# Patient Record
Sex: Male | Born: 1960 | Race: White | Hispanic: No | Marital: Single | Smoking: Current every day smoker
Health system: Southern US, Community
[De-identification: ages and names within clinical notes are randomized; demographics above are authoritative.]

## PROBLEM LIST (undated history)

## (undated) DIAGNOSIS — M1990 Unspecified osteoarthritis, unspecified site: Secondary | ICD-10-CM

## (undated) DIAGNOSIS — E7800 Pure hypercholesterolemia, unspecified: Secondary | ICD-10-CM

## (undated) DIAGNOSIS — M5430 Sciatica, unspecified side: Secondary | ICD-10-CM

## (undated) DIAGNOSIS — I10 Essential (primary) hypertension: Secondary | ICD-10-CM

## (undated) DIAGNOSIS — E119 Type 2 diabetes mellitus without complications: Secondary | ICD-10-CM

## (undated) DIAGNOSIS — J449 Chronic obstructive pulmonary disease, unspecified: Secondary | ICD-10-CM

## (undated) HISTORY — PX: OTHER SURGICAL HISTORY: SHX169

---

## 2007-09-30 ENCOUNTER — Emergency Department: Payer: Self-pay | Admitting: Emergency Medicine

## 2007-12-24 ENCOUNTER — Emergency Department: Payer: Self-pay | Admitting: Emergency Medicine

## 2007-12-24 IMAGING — US US PELVIS LIMITED
1 series · 17 of 25 positions shown · non-contrast
Comparison: none

REASON FOR EXAM: EVALUATE FOR ABSCESS
COMMENTS:

[Series 1: us pelvis limited · 17 of 41 slices shown]
[im 1/41]
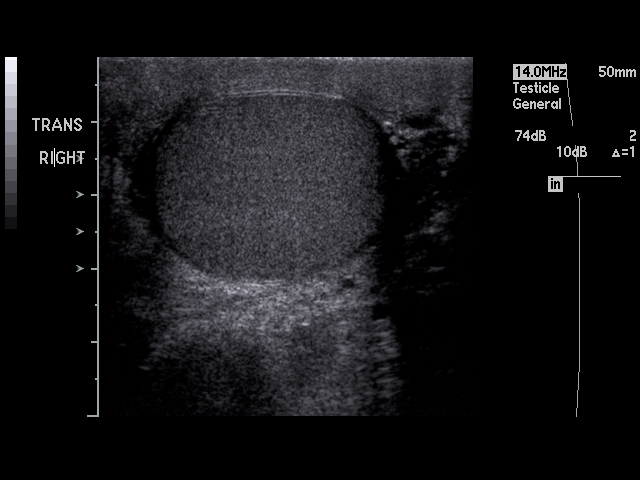
[im 4/41]
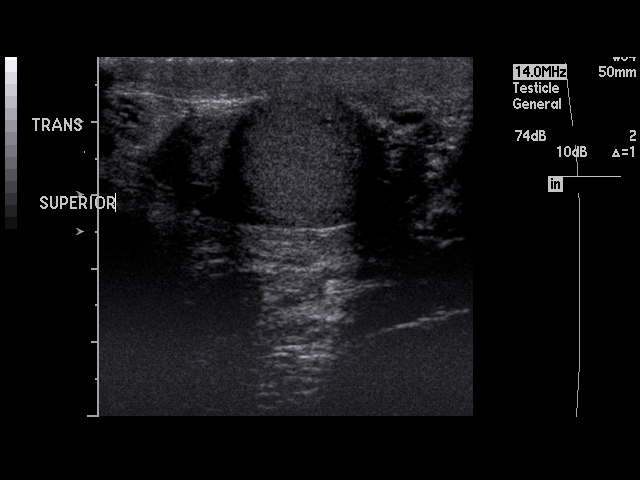
[im 6/41]
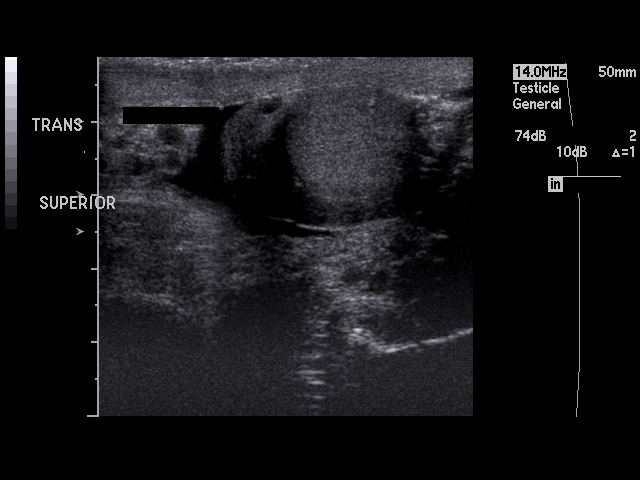
[im 9/41]
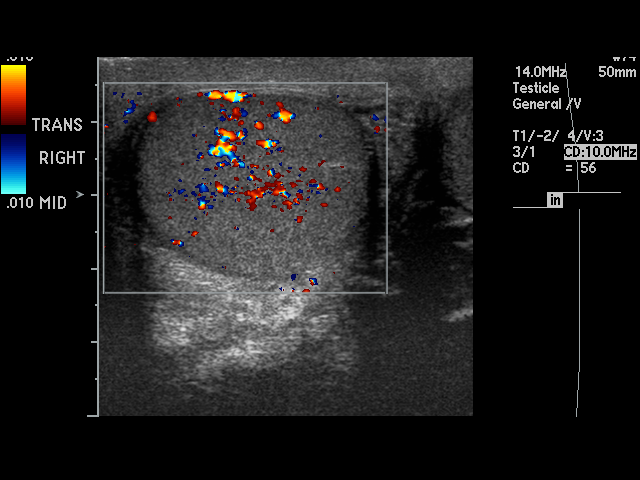
[im 11/41]
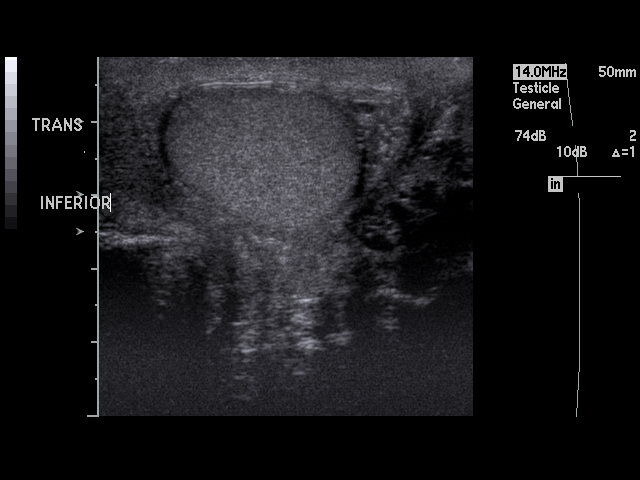
[im 14/41]
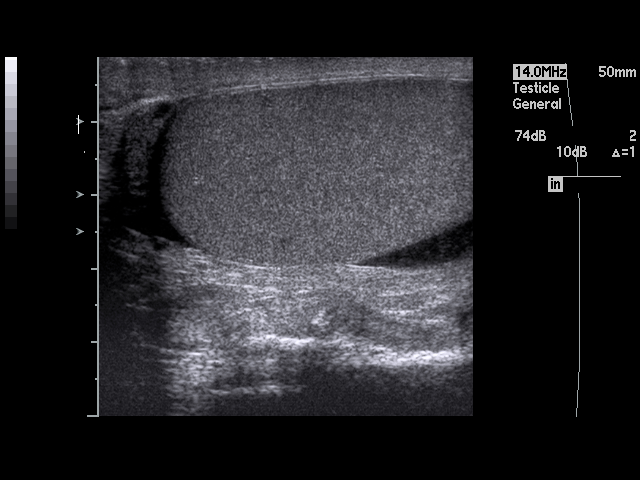
[im 16/41]
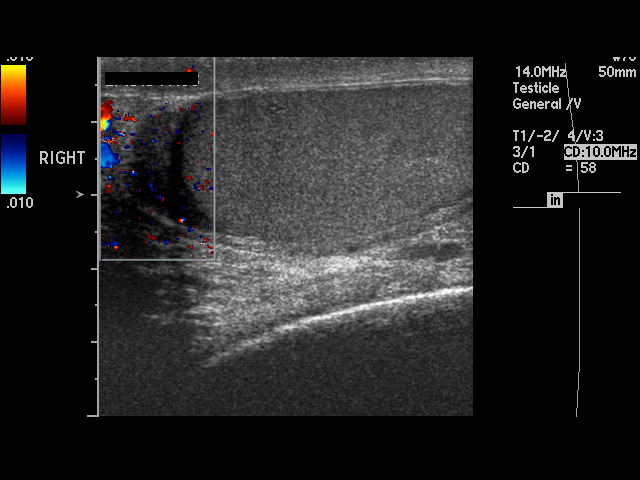
[im 19/41]
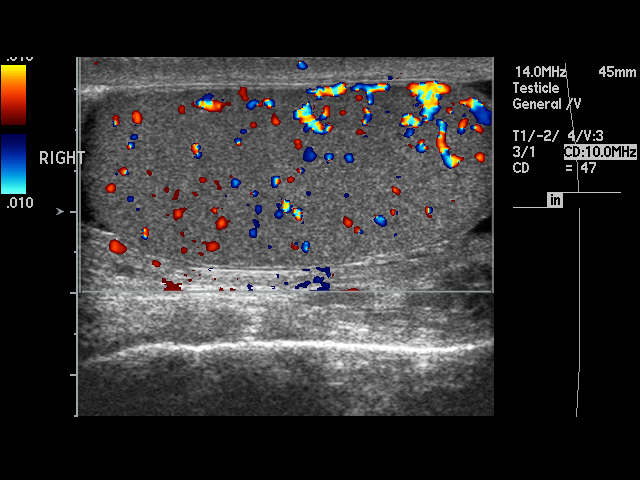
[im 21/41]
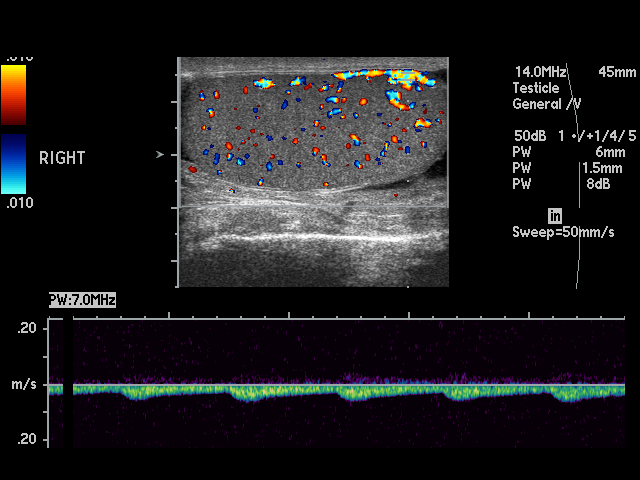
[im 22/41]
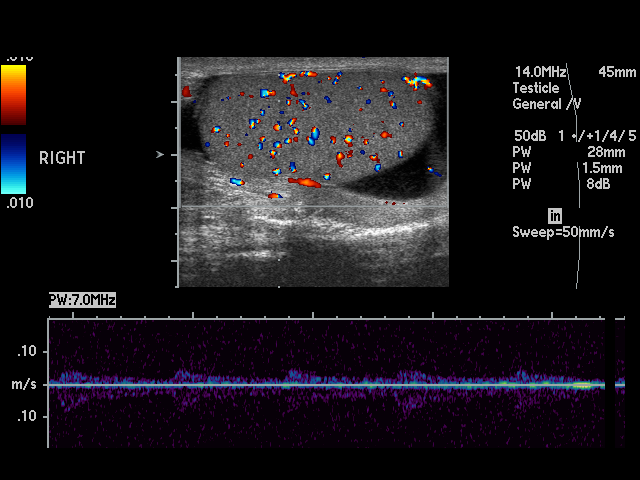
[im 26/41]
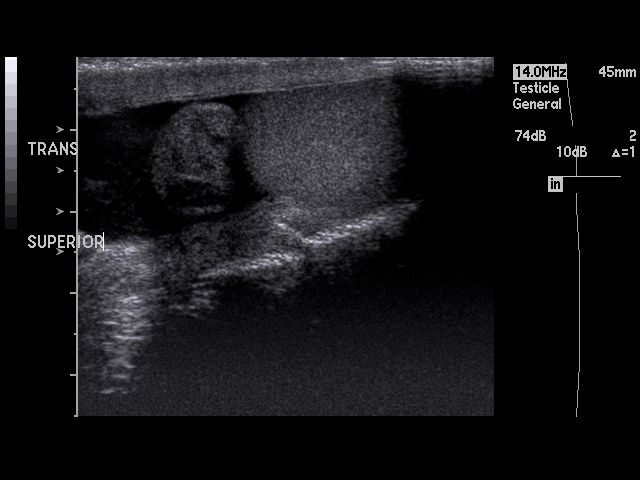
[im 27/41]
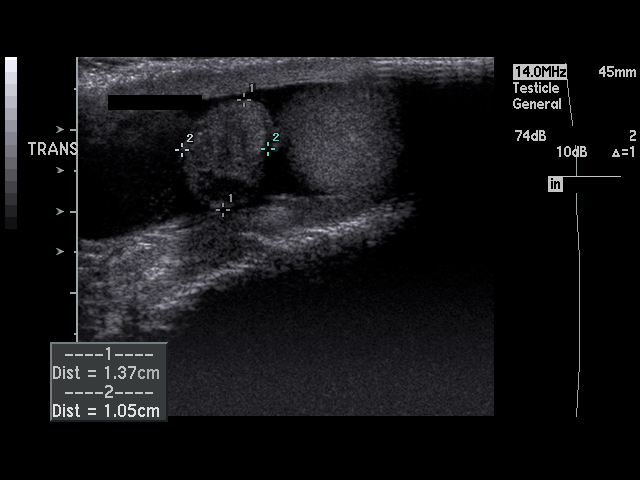
[im 31/41]
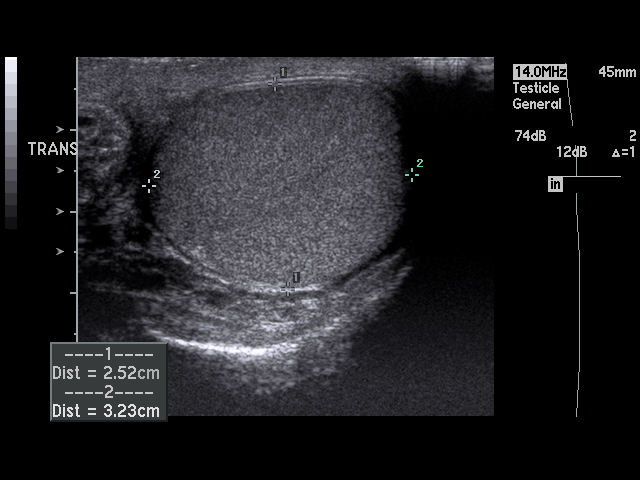
[im 32/41]
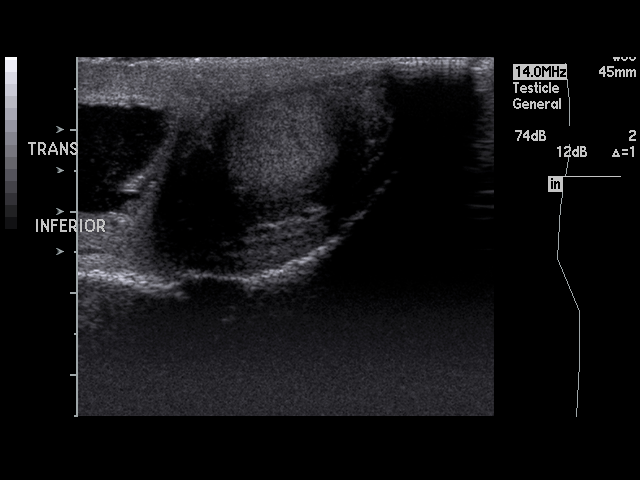
[im 36/41]
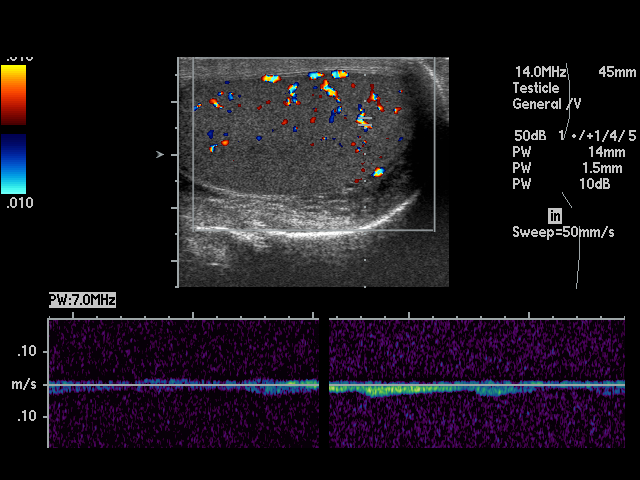
[im 37/41]
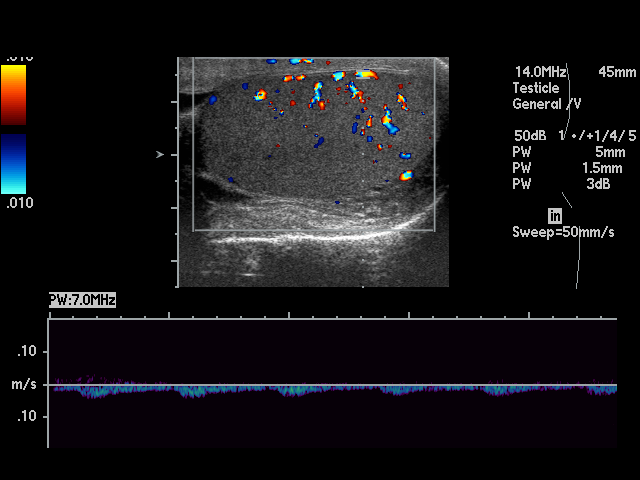
[im 41/41]
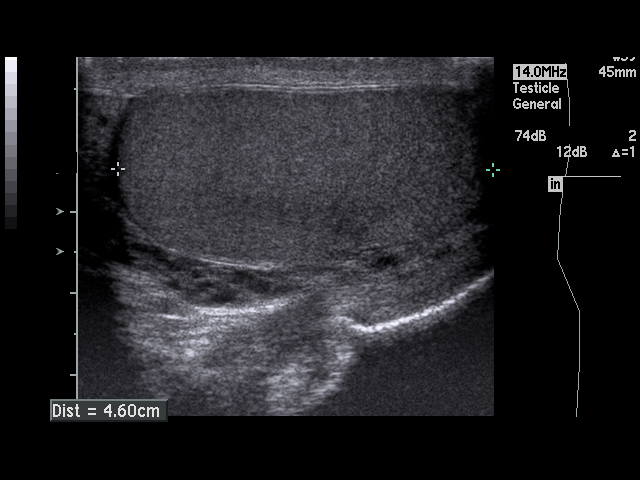

[17 of 25 positions shown; findings below may reference images not displayed]

PROCEDURE:     US  - US TESTICULAR  - December 24, 2007  [DATE]

RESULT:     The RIGHT testicle measures 5.0 x 2.63 x 3.26 cm. The LEFT
testicle  measures 4.6 x 2.59 x 3.2 cm. The testicles demonstrate a
homogeneous echotexture and grayscale flow is demonstrated within the RIGHT
and LEFT testicle.

The RIGHT epididymis measures 1.39 x 1.09 x 1.6 cm. The LEFT epididymis
measures 1.39 x 1.37 x 1.05 cm. Flow is identified within the RIGHT and LEFT
epididymides. A moderate sized, RIGHT hydrocele with internal echoes is
identified. A smaller, LEFT hydrocele is identified. There is thickening of
the tunica on the RIGHT.
IMPRESSION: 1.     No ultrasound evidence reflecting the sequelae of decreased flow to
the RIGHT or LEFT testicle to suggest torsion.
2.     Findings possibly representing cellulitis involving the tunica on the
RIGHT. There is a moderate size, complex hydrocele on the RIGHT and a small
hydrocele on the LEFT.
3.     Dr. Meet Gill of the Emergency Department was informed of these findings
via preliminary faxed report on 12/24/2007 at [DATE], Central Standard Time.

## 2009-12-19 ENCOUNTER — Emergency Department: Payer: Self-pay | Admitting: Emergency Medicine

## 2009-12-19 IMAGING — US ABDOMEN ULTRASOUND
1 series · 17 of 25 positions shown · non-contrast
Comparison: none

REASON FOR EXAM: pain, upper right Quad
COMMENTS:

PROCEDURE:     US  - US ABDOMEN GENERAL SURVEY  - December 19, 2009  [DATE]
RESULT:     Comparison: None
TECHNIQUE: Multiple gray-scale and color-flow Doppler images of the abdomen
are presented for review.

[Series 1: abdomen ultrasound · 17 of 61 slices shown]
[im 1/61]
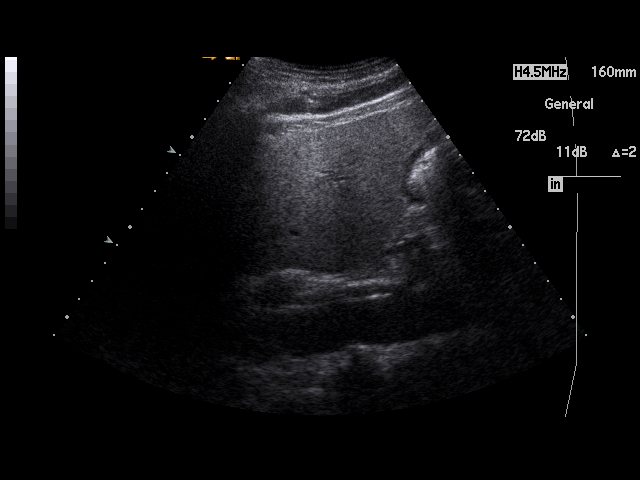
[im 6/61]
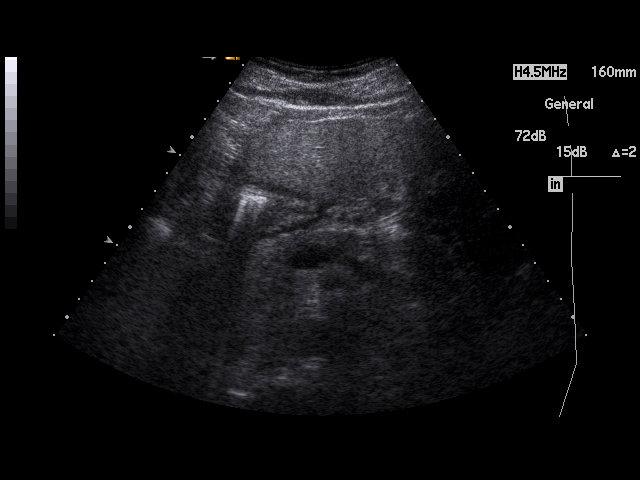
[im 8/61]
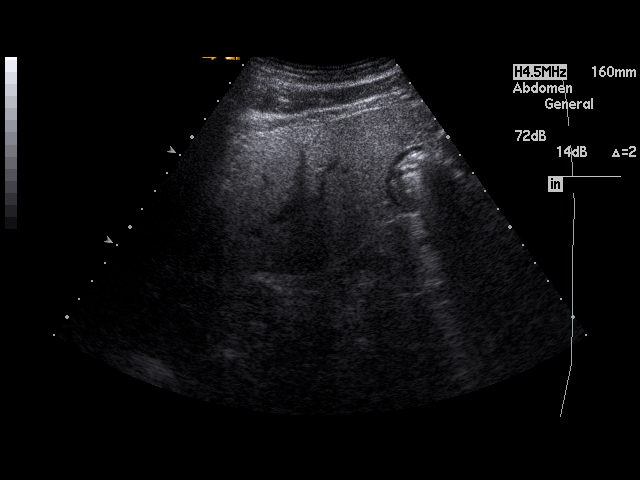
[im 13/61]
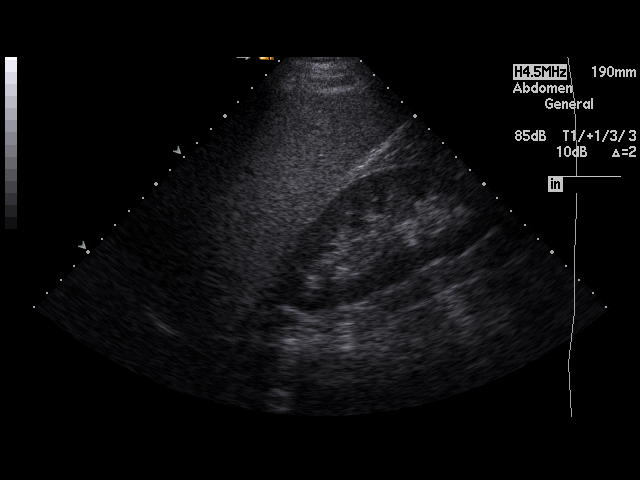
[im 16/61]
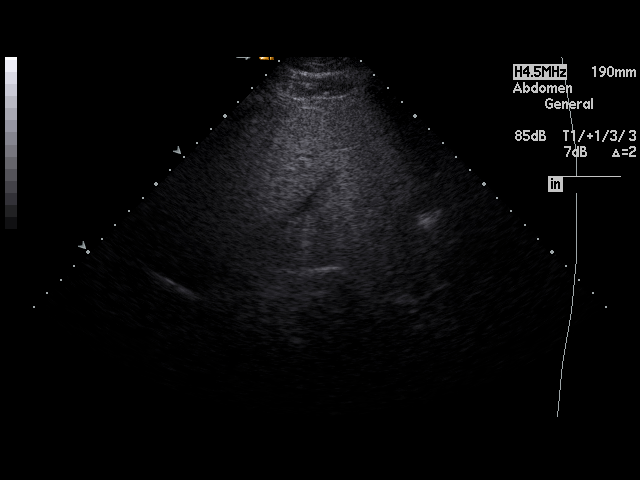
[im 21/61]
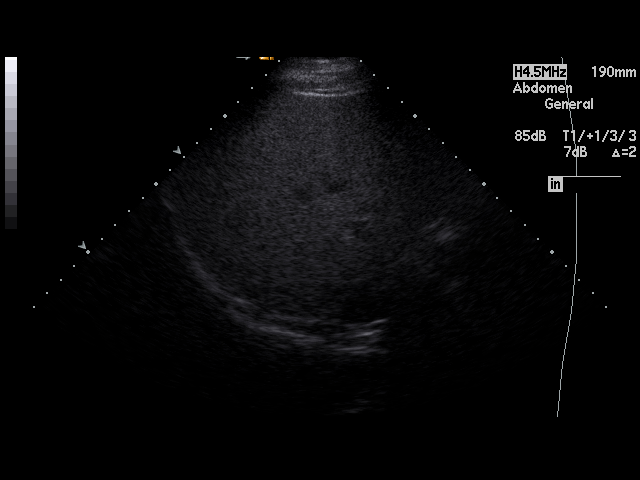
[im 23/61]
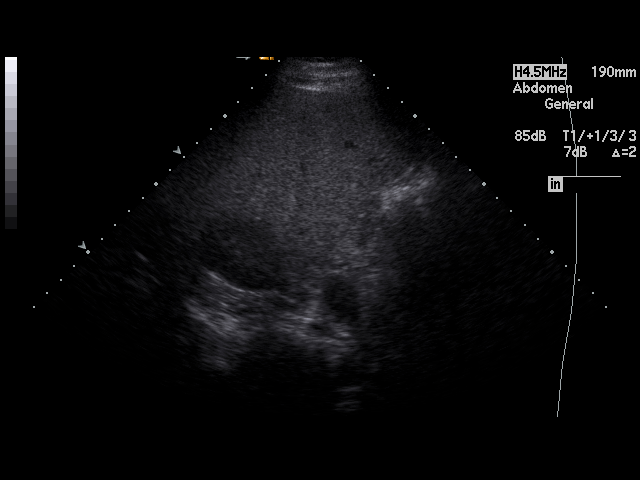
[im 28/61]
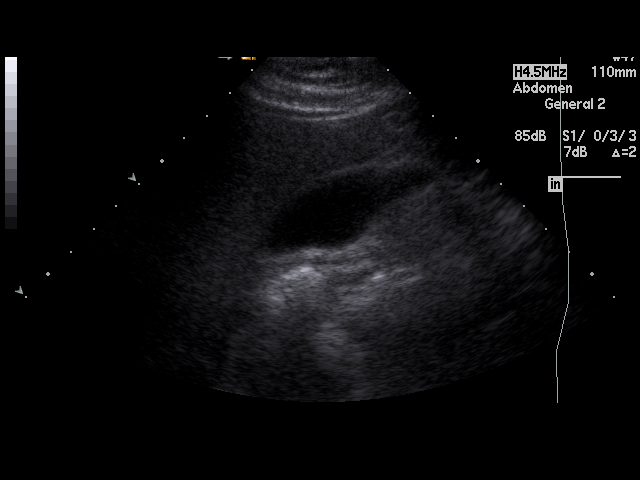
[im 31/61]
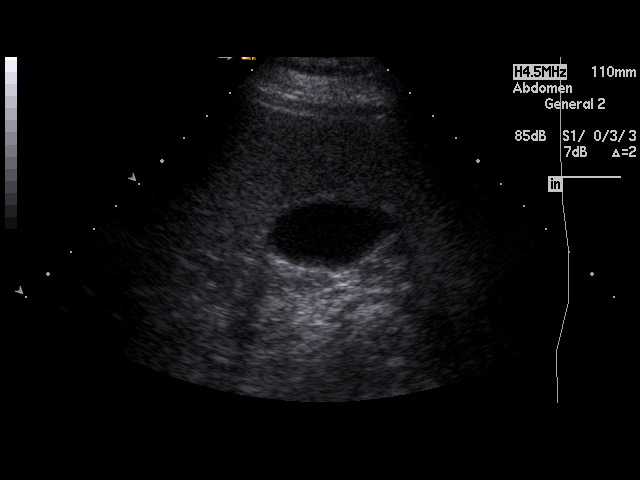
[im 33/61]
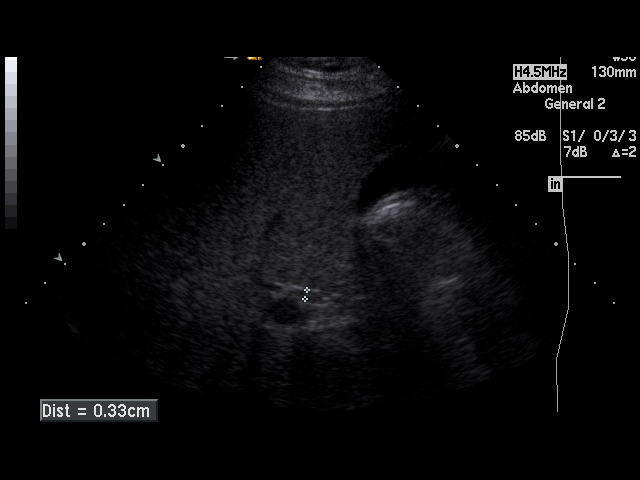
[im 38/61]
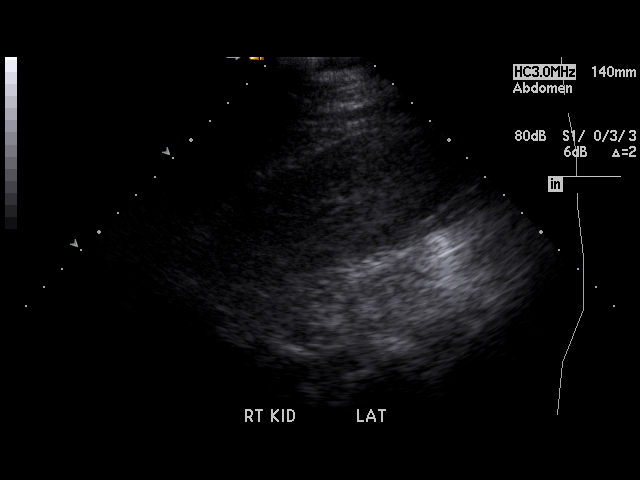
[im 41/61]
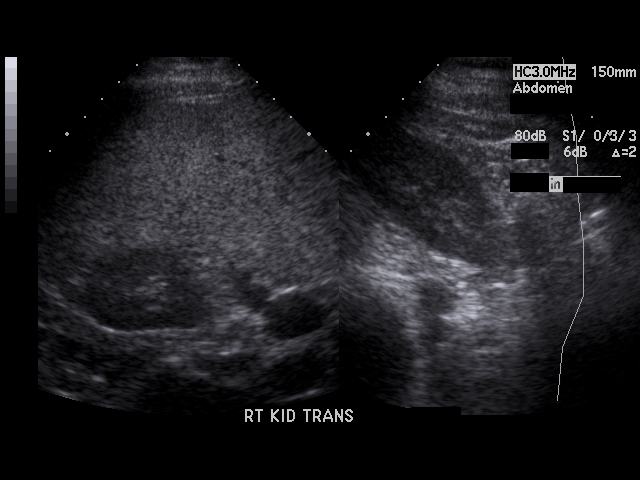
[im 46/61]
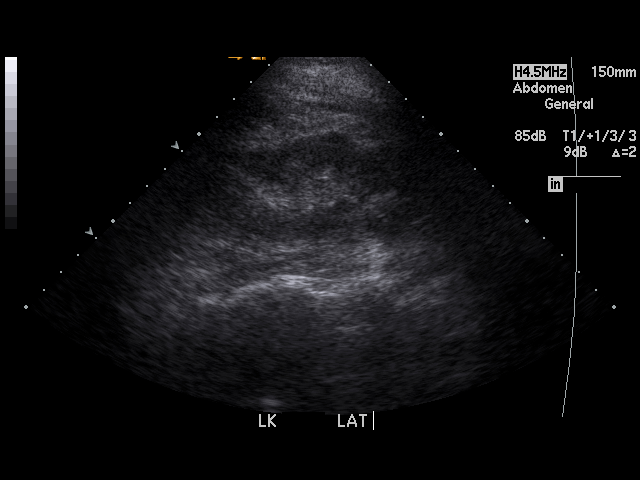
[im 48/61]
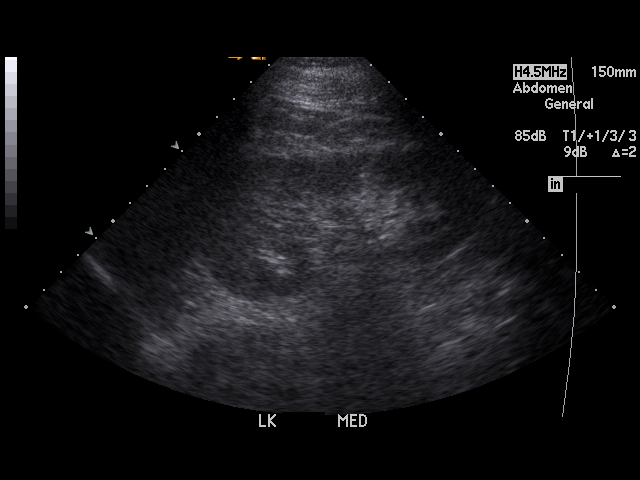
[im 53/61]
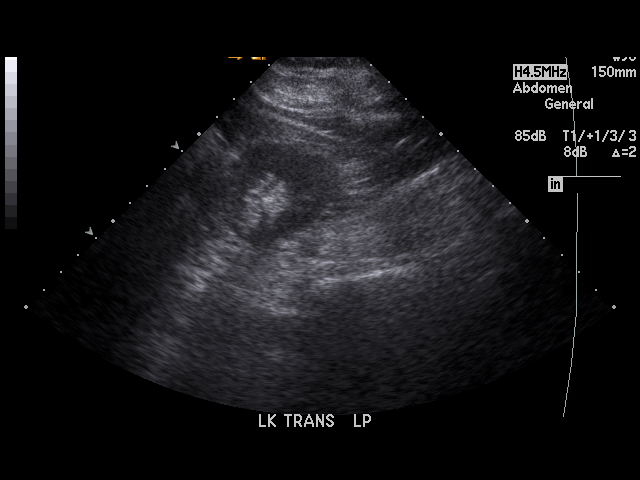
[im 56/61]
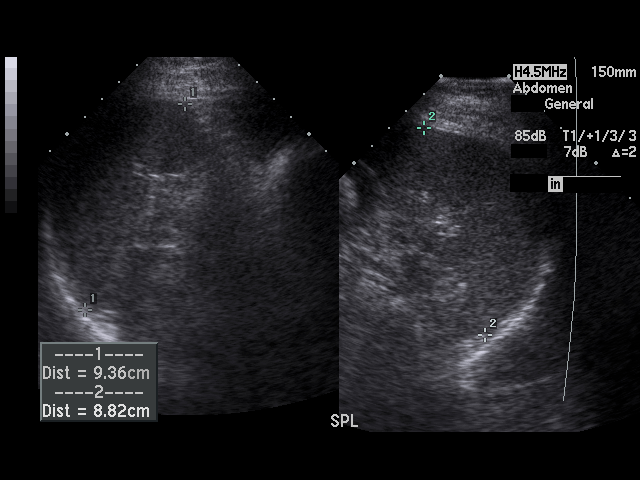
[im 61/61]
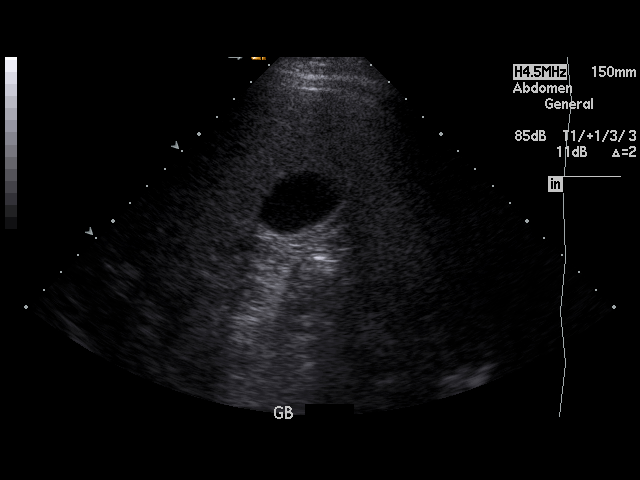

[17 of 25 positions shown; findings below may reference images not displayed]

FINDINGS: The liver is diffusely increased in echogenicity likely secondary to hepatic
steatosis. The liver is without evidence of a focal hepatic lesion.

There is no cholelithiasis or biliary sludge. There is no intra- or
extrahepatic biliary ductal dilatation. The common duct measures 3.3 mm in
maximal diameter. There is no gallbladder wall thickening, pericholecystic
fluid, or sonographic Murphy's sign.

The visualized portion of the pancreas is normal in echogenicity. The spleen
is unremarkable. Bilateral kidneys are normal in echogenicity and size. The
right kidney measures 13.9 x 5.9 x 5 cm. The left kidney measures 12.2 x
x 4.3 cm. There are 2 punctate nonobstructing left renal calculi. There is
no hydronephrosis. The abdominal aorta and IVC are unremarkable.
IMPRESSION: 1. Hepatic steatosis.

2. Nonobstructing left renal calculi.

3. No cholelithiasis or sonographic evidence of acute cholecystitis.

## 2009-12-19 IMAGING — CR DG CHEST 2V
1 series · 3 of 3 positions shown · non-contrast
Comparison: none

REASON FOR EXAM: epigastric pain
COMMENTS:

PROCEDURE:     DXR - DXR CHEST PA (OR AP) AND LATERAL  - December 19, 2009  [DATE]
RESULT:     Comparison: None

[Series 1: view not recorded · 0.17mm/px · 3 of 3 slices shown]
[im 1/3]
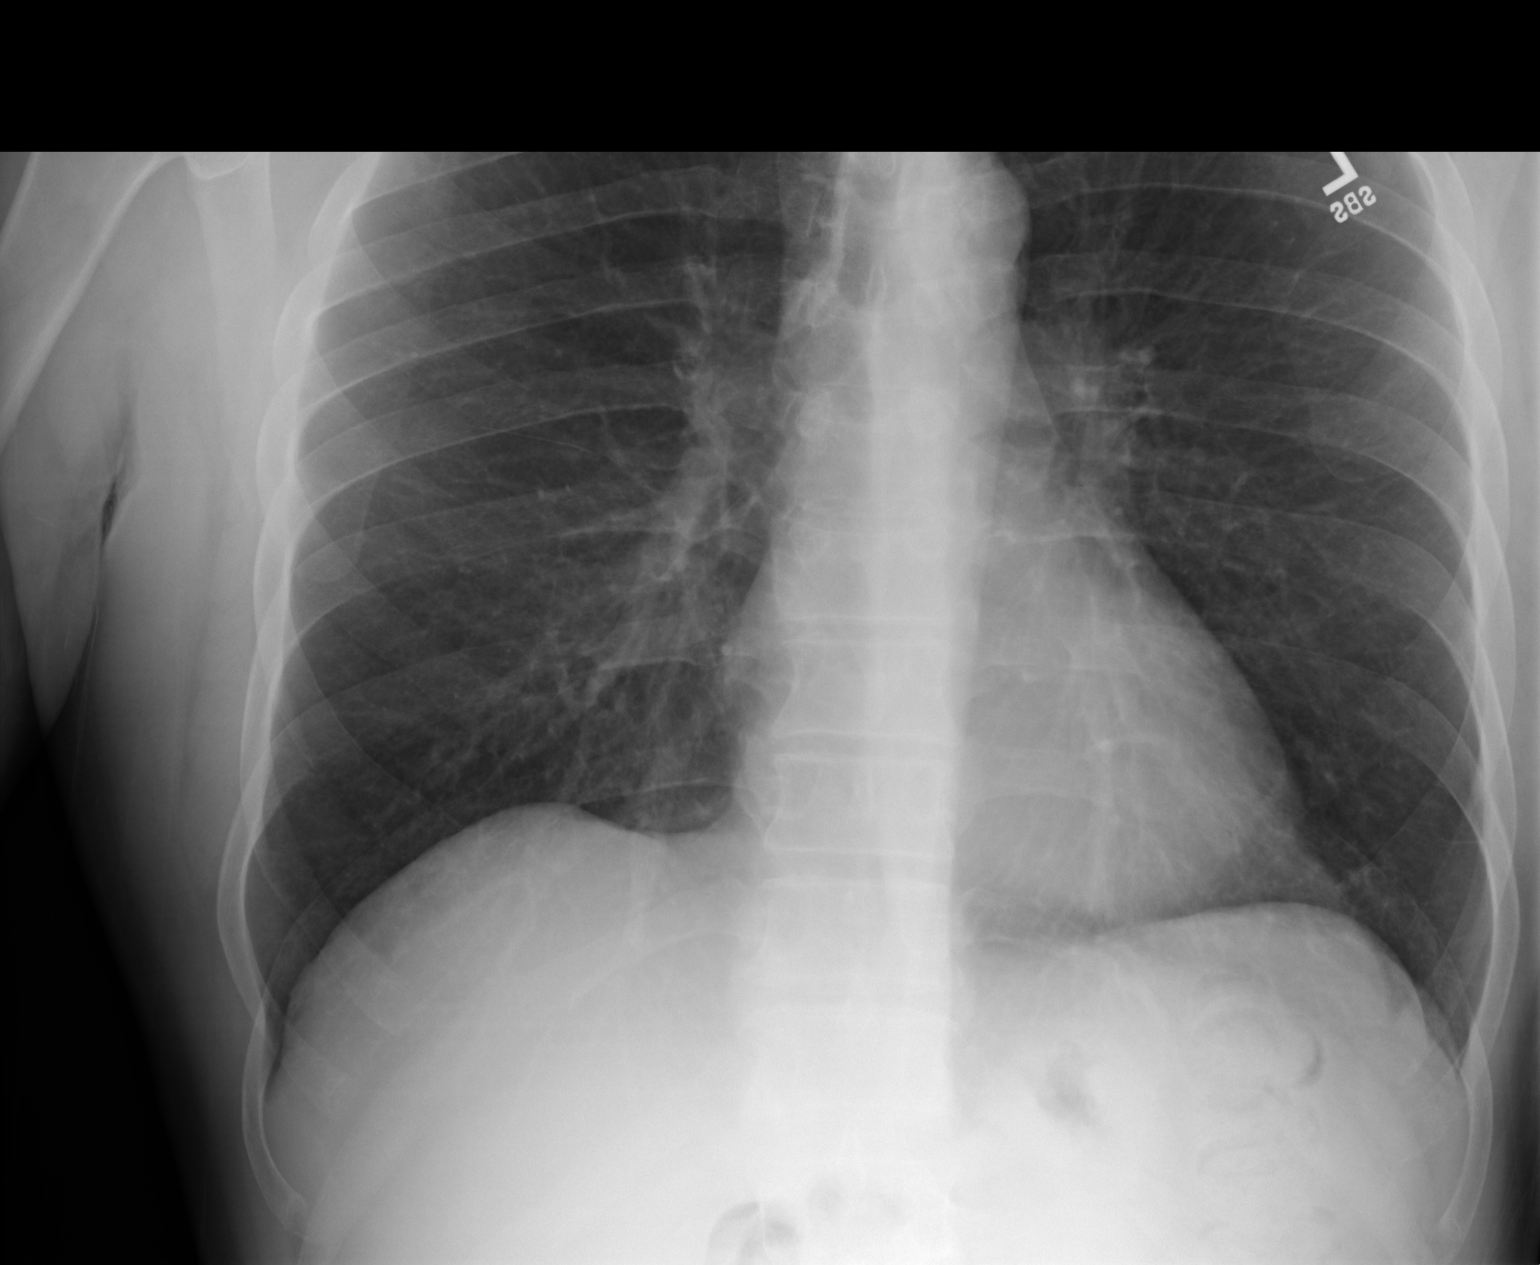
[im 2/3]
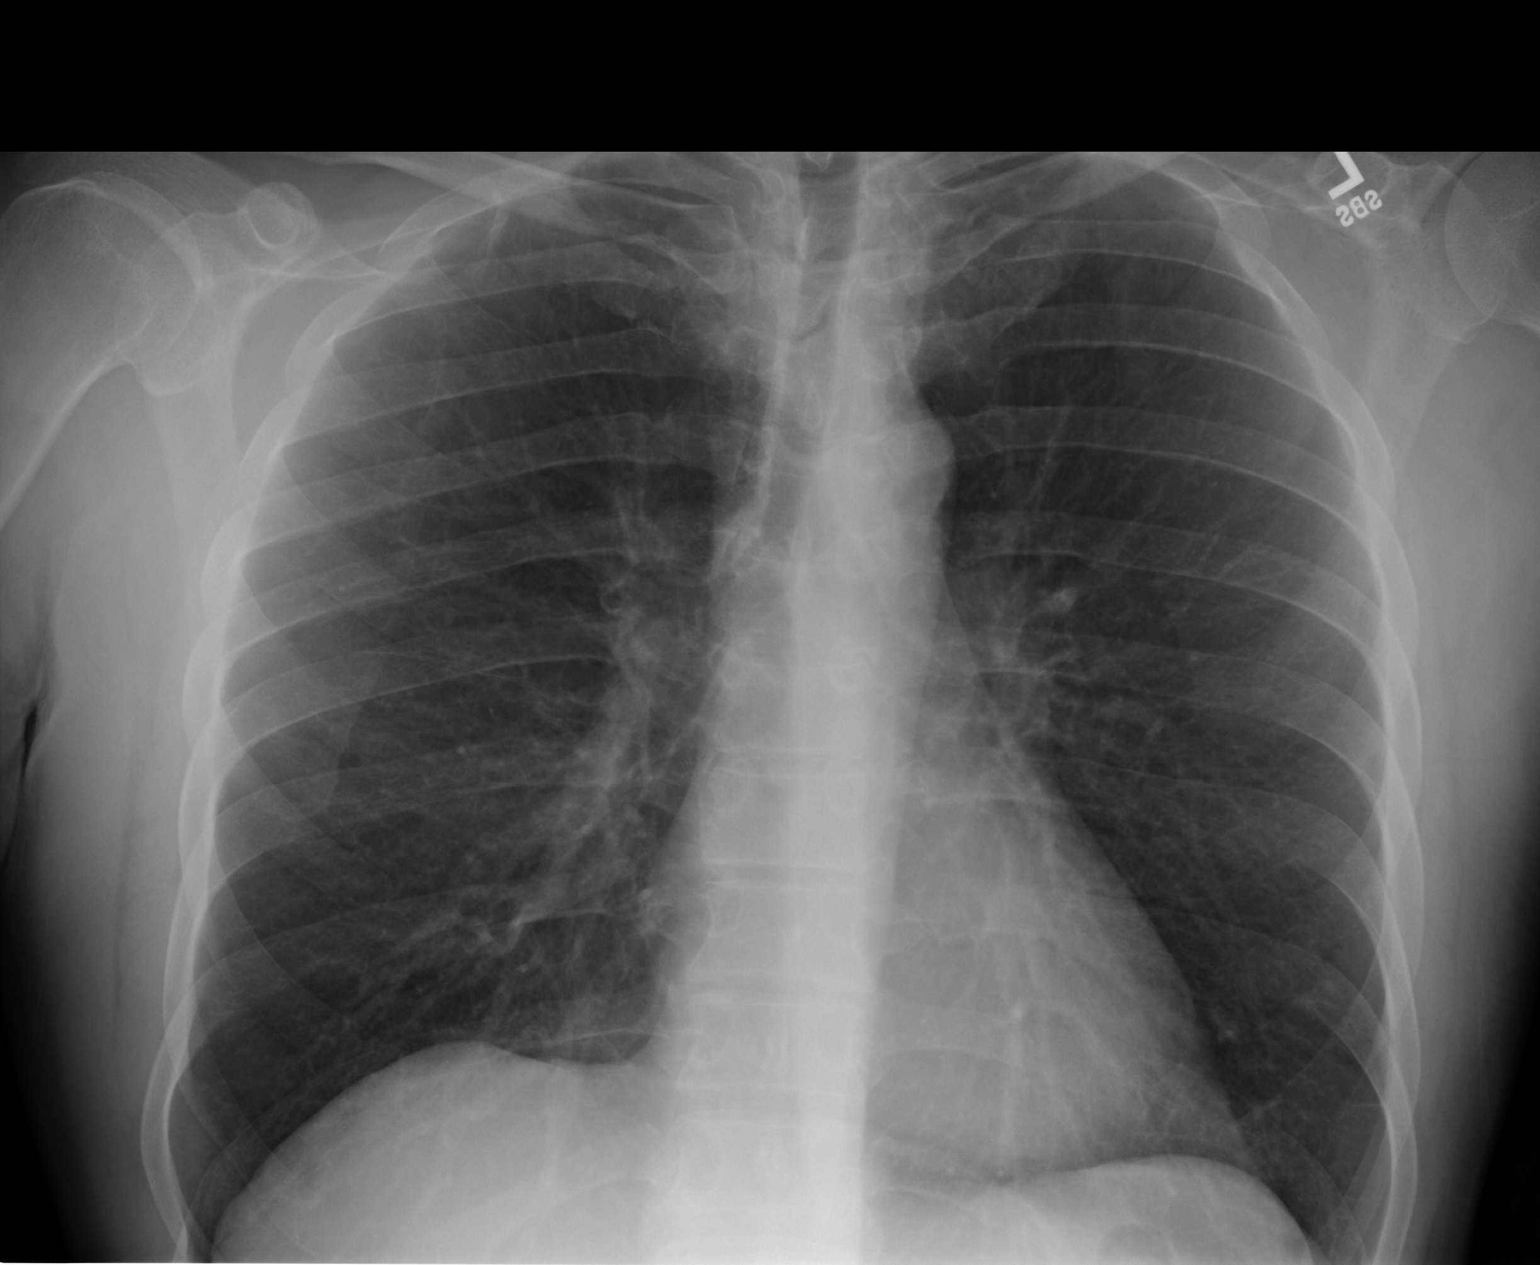
[im 3/3]
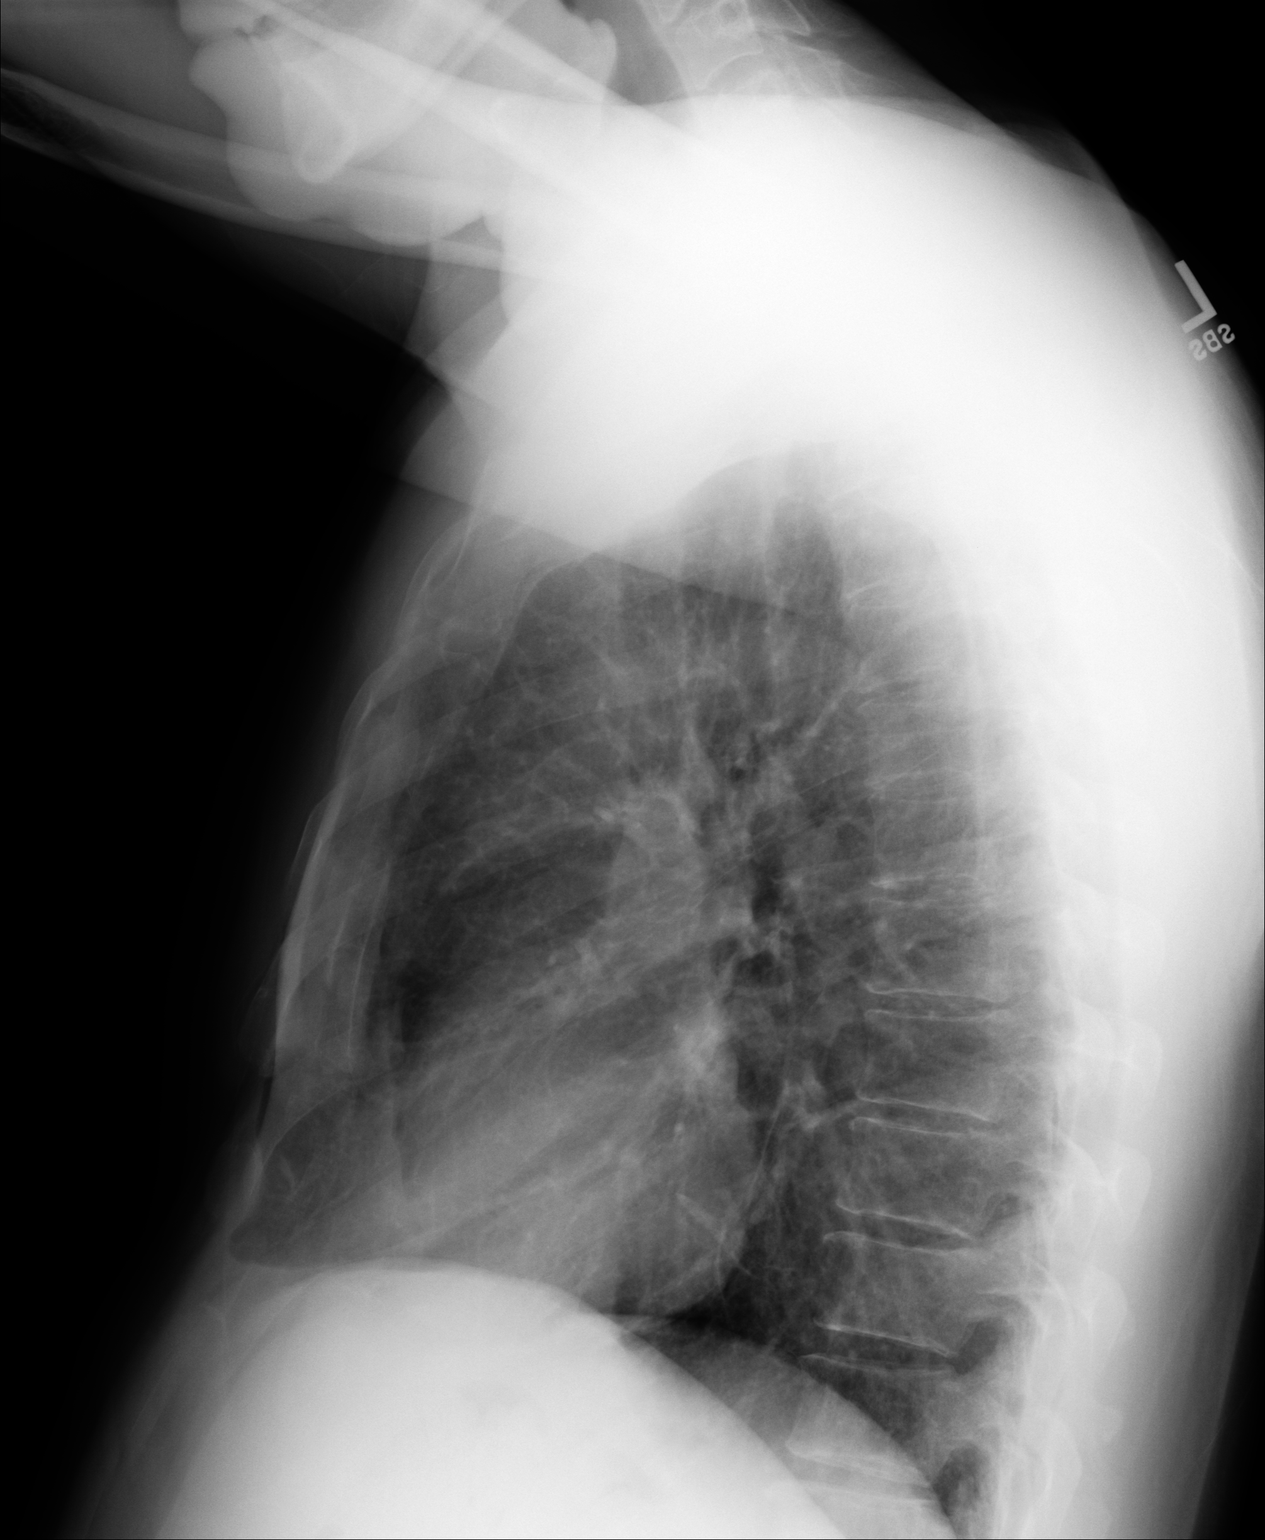

[3 of 3 positions shown; findings below may reference images not displayed]

FINDINGS: PA and lateral chest radiographs are provided. There is no focal parenchymal
opacity, pleural effusion, or pneumothorax. The heart and mediastinum are
unremarkable. The osseous structures are unremarkable.
IMPRESSION: No acute disease of the chest.

## 2010-02-20 ENCOUNTER — Emergency Department: Payer: Self-pay | Admitting: Unknown Physician Specialty

## 2010-02-20 IMAGING — CR DG HAND 2V*L*
1 series · 2 of 2 positions shown · non-contrast
Comparison: none

REASON FOR EXAM: pain swelling ?fb
COMMENTS:

PROCEDURE:     DXR - DXR HAND LT TWO VIEWS  - February 20, 2010 [DATE]
RESULT:     Comparison:  None

[Series 1: view not recorded · 0.17mm/px · 2 of 2 slices shown]
[im 1/2]
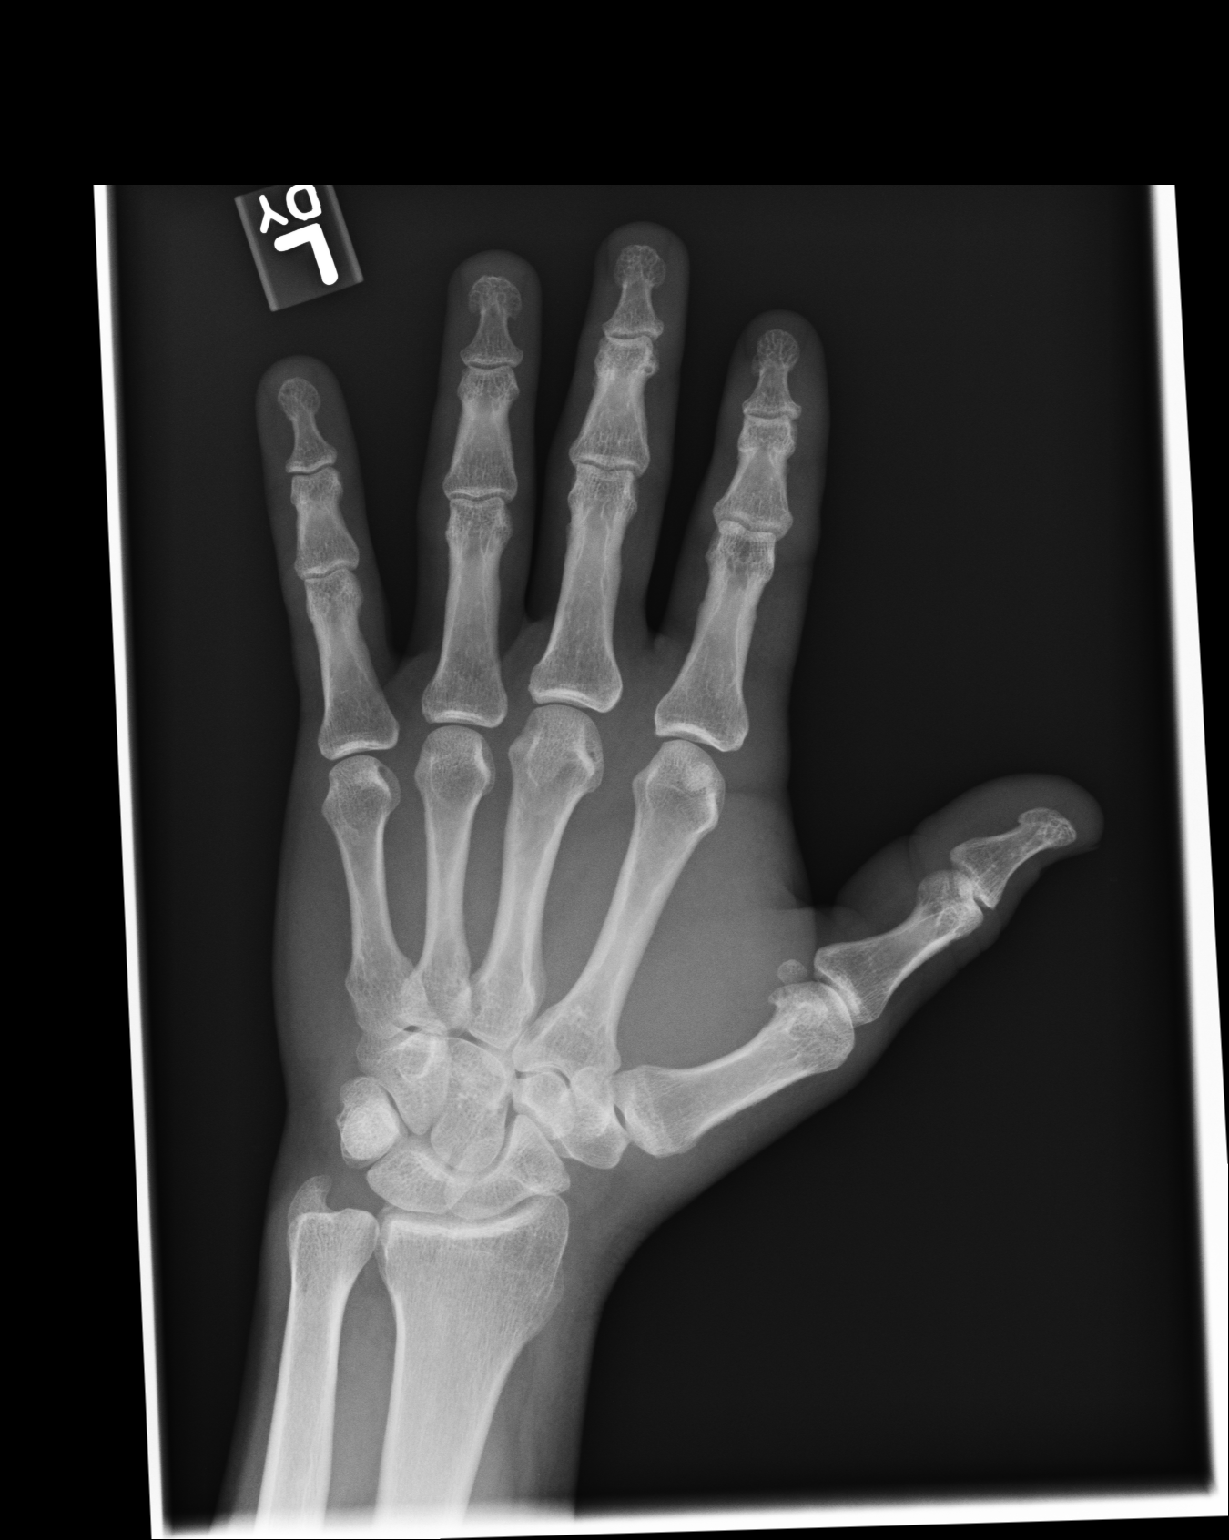
[im 2/2]
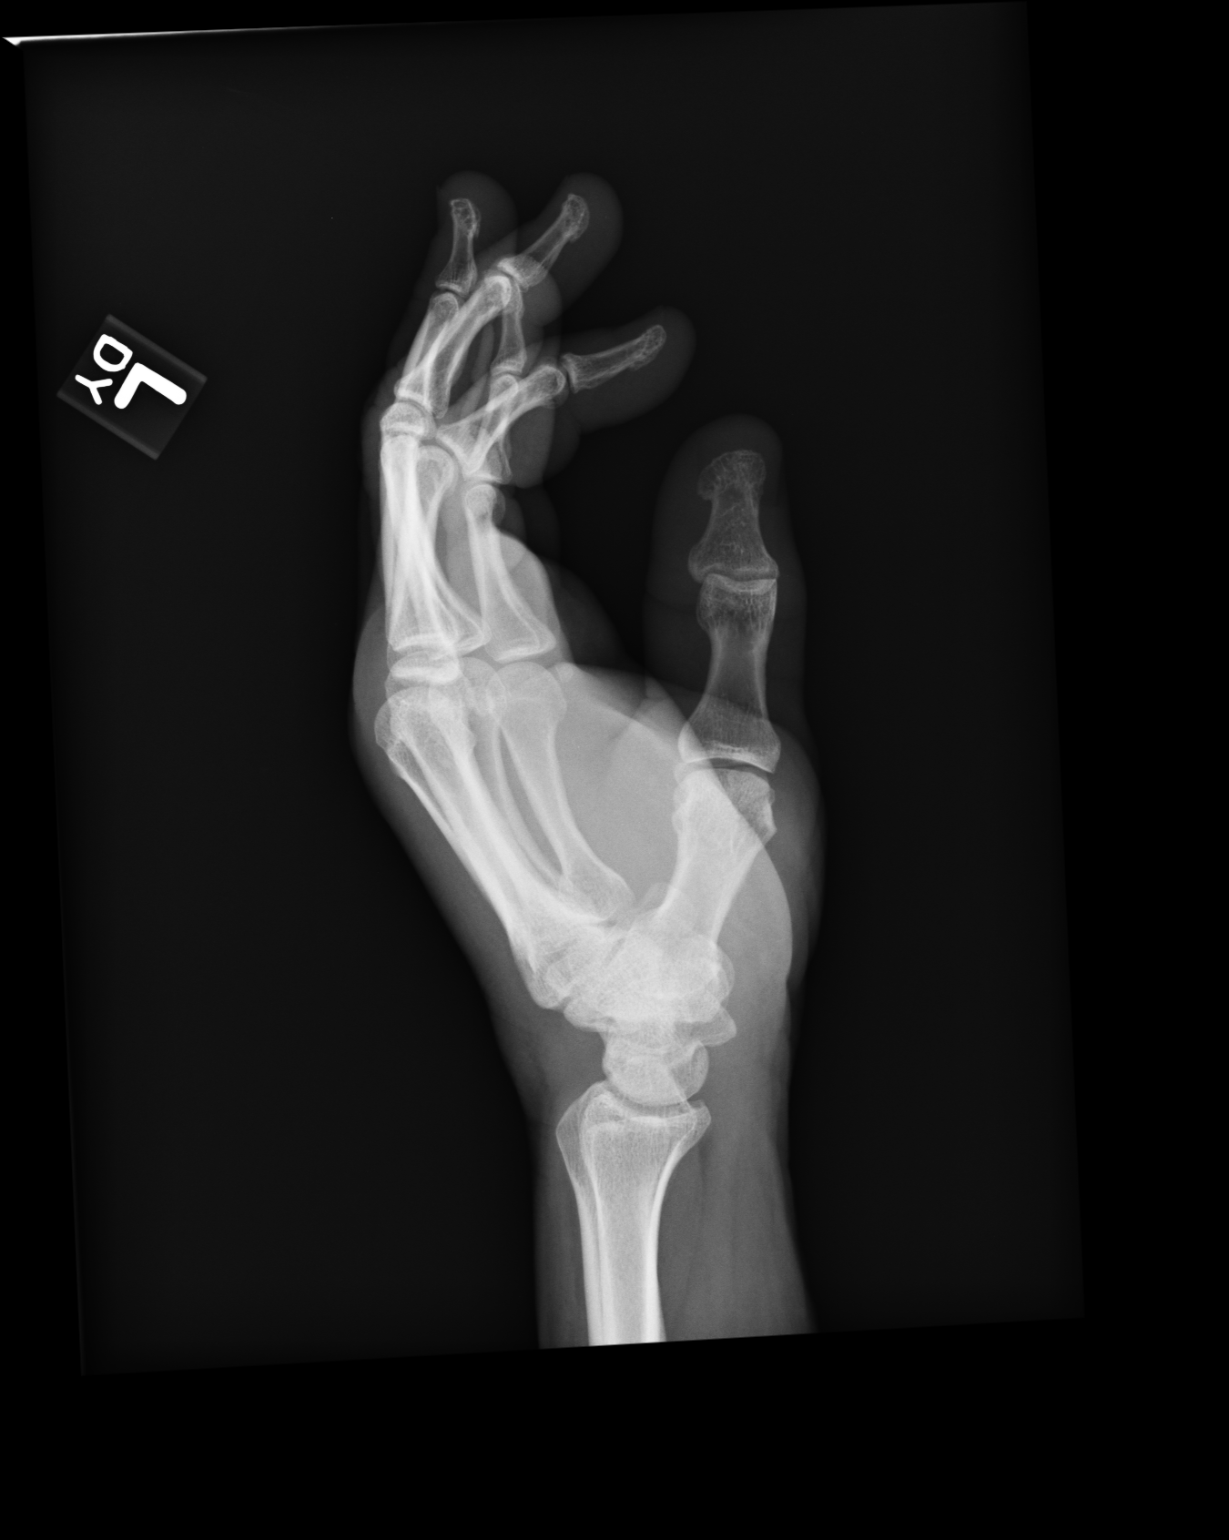

[2 of 2 positions shown; findings below may reference images not displayed]

FINDINGS: AP and lateral views of the left hand demonstrates no fracture or
dislocation. There is normal bone mineralization. There are no erosive
changes. The joint spaces are maintained. There is no soft tissue swelling.
IMPRESSION: No acute osseous abnormality of the left hand.

## 2010-07-13 ENCOUNTER — Emergency Department: Payer: Self-pay | Admitting: Emergency Medicine

## 2011-08-25 ENCOUNTER — Emergency Department: Payer: Self-pay | Admitting: Emergency Medicine

## 2011-12-25 ENCOUNTER — Emergency Department: Payer: Self-pay | Admitting: *Deleted

## 2012-07-03 ENCOUNTER — Emergency Department: Payer: Self-pay

## 2012-07-03 LAB — CBC
HCT: 44.3 % (ref 40.0–52.0)
HGB: 15.1 g/dL (ref 13.0–18.0)
MCH: 32.4 pg (ref 26.0–34.0)
Platelet: 235 10*3/uL (ref 150–440)
RBC: 4.66 10*6/uL (ref 4.40–5.90)
WBC: 7.5 10*3/uL (ref 3.8–10.6)

## 2012-07-03 LAB — COMPREHENSIVE METABOLIC PANEL
Albumin: 3.7 g/dL (ref 3.4–5.0)
Alkaline Phosphatase: 119 U/L (ref 50–136)
BUN: 17 mg/dL (ref 7–18)
Calcium, Total: 8.5 mg/dL (ref 8.5–10.1)
Chloride: 106 mmol/L (ref 98–107)
Co2: 28 mmol/L (ref 21–32)
EGFR (African American): >60
SGOT(AST): 21 U/L (ref 15–37)
SGPT (ALT): 27 U/L (ref 12–78)
Total Protein: 7.2 g/dL (ref 6.4–8.2)

## 2012-07-03 LAB — DRUG SCREEN, URINE
Benzodiazepine, Ur Scrn: NEGATIVE (ref ?–200)
Cannabinoid 50 Ng, Ur ~~LOC~~: NEGATIVE (ref ?–50)
Cocaine Metabolite,Ur ~~LOC~~: NEGATIVE (ref ?–300)
MDMA (Ecstasy)Ur Screen: NEGATIVE (ref ?–500)
Phencyclidine (PCP) Ur S: NEGATIVE (ref ?–25)
Tricyclic, Ur Screen: NEGATIVE (ref ?–1000)

## 2012-07-03 LAB — URINALYSIS, COMPLETE
Bilirubin,UR: NEGATIVE
Glucose,UR: >=500 mg/dL (ref 0–75)
Leukocyte Esterase: NEGATIVE
Nitrite: NEGATIVE
Ph: 6 (ref 4.5–8.0)
Protein: NEGATIVE
RBC,UR: 2 /HPF (ref 0–5)
WBC UR: 1 /HPF (ref 0–5)

## 2012-07-03 LAB — LIPASE, BLOOD: Lipase: 147 U/L (ref 73–393)

## 2015-08-28 ENCOUNTER — Emergency Department: Payer: Self-pay

## 2015-08-28 ENCOUNTER — Emergency Department
Admission: EM | Admit: 2015-08-28 | Discharge: 2015-08-28 | Disposition: A | Discharge status: Home / Self Care | Payer: Self-pay | Attending: Student | Admitting: Student

## 2015-08-28 ENCOUNTER — Encounter: Payer: Self-pay | Admitting: Emergency Medicine

## 2015-08-28 DIAGNOSIS — F17210 Nicotine dependence, cigarettes, uncomplicated: Secondary | ICD-10-CM | POA: Insufficient documentation

## 2015-08-28 DIAGNOSIS — J189 Pneumonia, unspecified organism: Secondary | ICD-10-CM | POA: Insufficient documentation

## 2015-08-28 DIAGNOSIS — E119 Type 2 diabetes mellitus without complications: Secondary | ICD-10-CM | POA: Insufficient documentation

## 2015-08-28 HISTORY — DX: Type 2 diabetes mellitus without complications: E11.9

## 2015-08-28 LAB — BASIC METABOLIC PANEL
Anion gap: 12 (ref 5–15)
BUN: 19 mg/dL (ref 6–20)
CALCIUM: 9.5 mg/dL (ref 8.9–10.3)
CHLORIDE: 99 mmol/L — AB (ref 101–111)
CO2: 25 mmol/L (ref 22–32)
CREATININE: 1.27 mg/dL — AB (ref 0.61–1.24)
GFR calc non Af Amer: >60 mL/min (ref >60)
Glucose, Bld: 170 mg/dL — ABNORMAL HIGH (ref 65–99)
Potassium: 3.4 mmol/L — ABNORMAL LOW (ref 3.5–5.1)
SODIUM: 136 mmol/L (ref 135–145)

## 2015-08-28 LAB — CBC WITH DIFFERENTIAL/PLATELET
BASOS PCT: 0 %
Basophils Absolute: 0 10*3/uL (ref 0–0.1)
EOS ABS: 0 10*3/uL (ref 0–0.7)
Eosinophils Relative: 0 %
HEMATOCRIT: 48 % (ref 40.0–52.0)
Hemoglobin: 16.1 g/dL (ref 13.0–18.0)
Lymphocytes Relative: 6 %
Lymphs Abs: 0.9 10*3/uL — ABNORMAL LOW (ref 1.0–3.6)
MCH: 31.7 pg (ref 26.0–34.0)
MCHC: 33.5 g/dL (ref 32.0–36.0)
MCV: 94.4 fL (ref 80.0–100.0)
MONOS PCT: 7 %
Monocytes Absolute: 1 10*3/uL (ref 0.2–1.0)
NEUTROS ABS: 13.1 10*3/uL — AB (ref 1.4–6.5)
NEUTROS PCT: 87 %
Platelets: 223 10*3/uL (ref 150–440)
RBC: 5.08 MIL/uL (ref 4.40–5.90)
RDW: 13.6 % (ref 11.5–14.5)
WBC: 15.1 10*3/uL — AB (ref 3.8–10.6)

## 2015-08-28 LAB — INFLUENZA PANEL BY PCR (TYPE A & B)
H1N1 flu by pcr: NOT DETECTED
INFLBPCR: NEGATIVE
Influenza A By PCR: NEGATIVE

## 2015-08-28 IMAGING — CR DG CHEST 2V
2 series · 2 of 2 positions shown · non-contrast
Comparison: 12/19/2009

CLINICAL DATA: Cough and chills for 2 days. Weakness. History of
diabetes, smoking.

EXAM:
CHEST  2 VIEW

[chest pa]
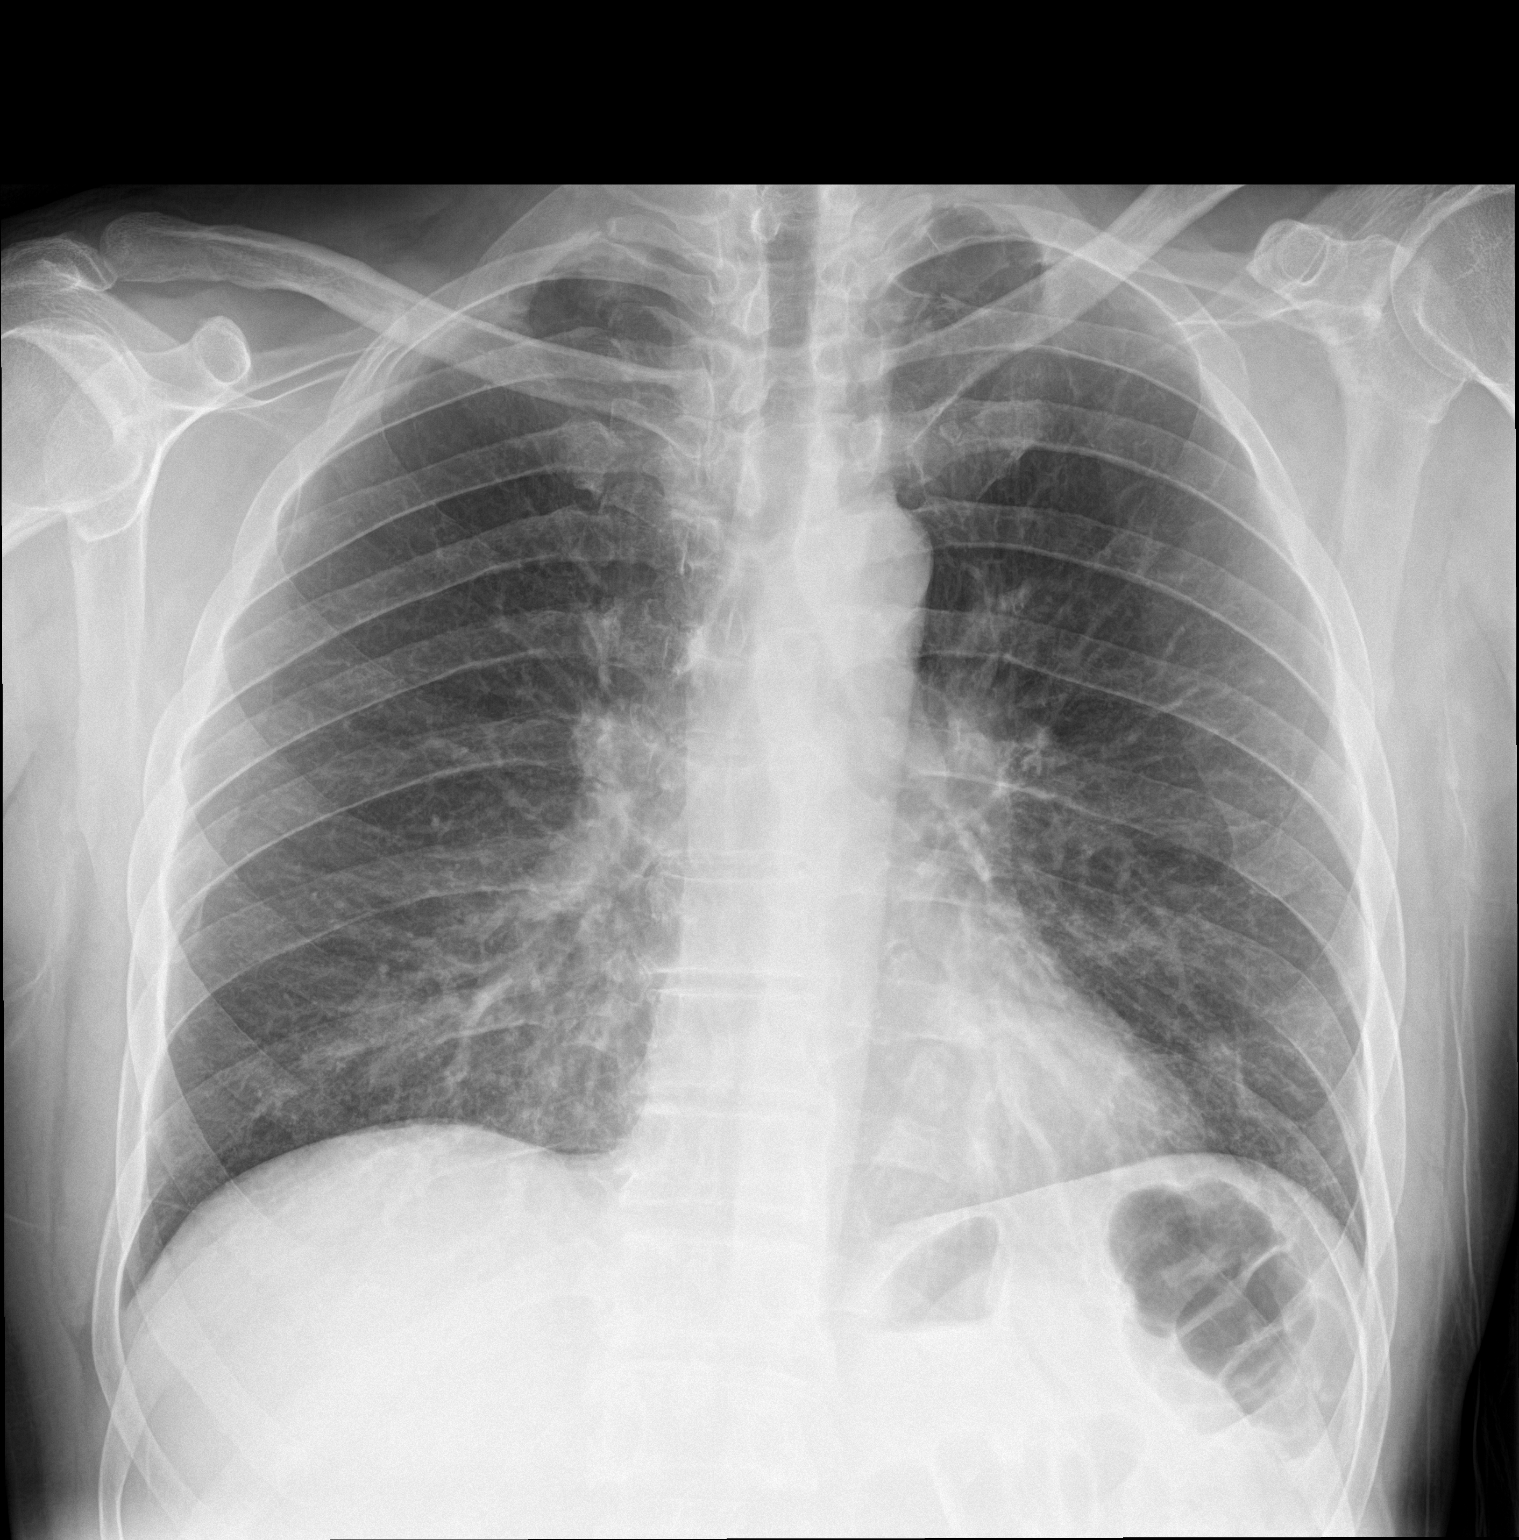

[chest lat]
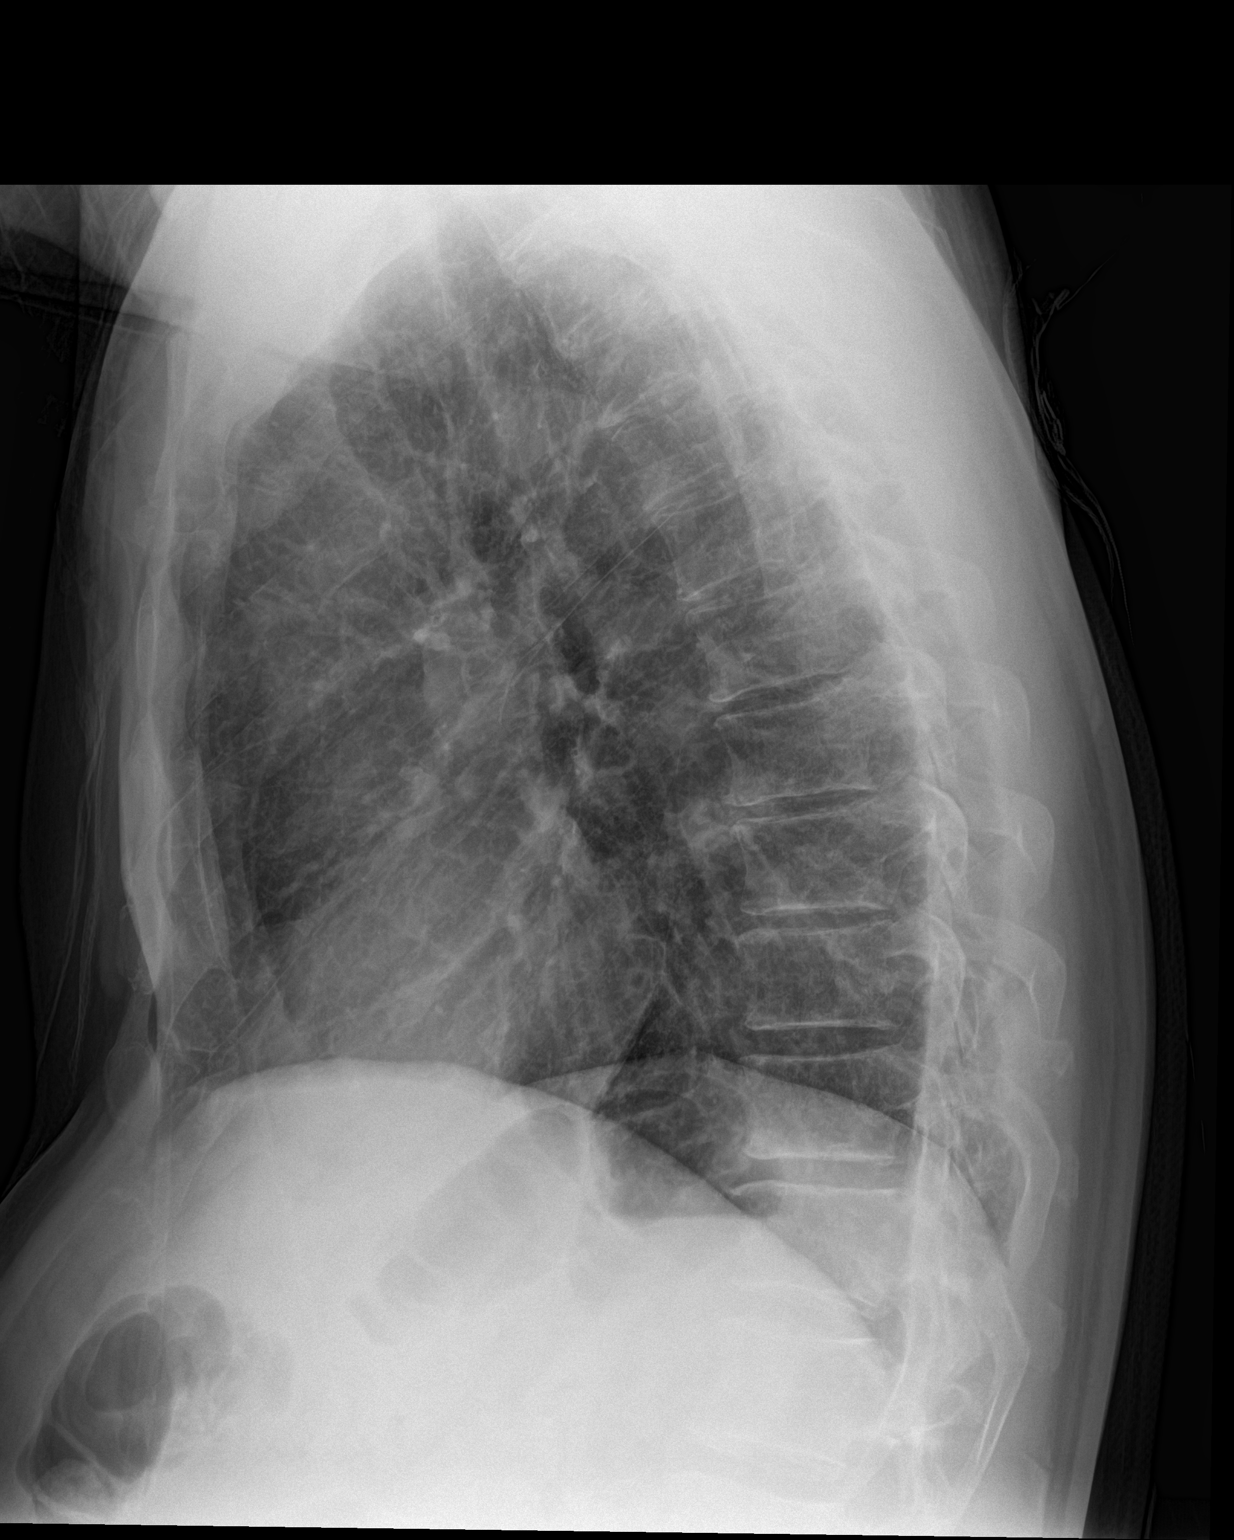

[2 of 2 positions shown; findings below may reference images not displayed]

FINDINGS: Heart size is normal. There is perihilar peribronchial thickening
and prominence of interstitial markings, consistent with viral
pneumonitis. There are no focal consolidations. No pleural effusions
or pulmonary edema.
IMPRESSION: Findings consistent with viral pneumonitis.

## 2015-08-28 MED ORDER — ALBUTEROL SULFATE HFA 108 (90 BASE) MCG/ACT IN AERS: 2.0000 | INHALATION_SPRAY | Freq: Four times a day (QID) | RESPIRATORY_TRACT | Status: DC | PRN

## 2015-08-28 MED ORDER — AZITHROMYCIN 250 MG PO TABS: ORAL_TABLET | ORAL | Status: DC

## 2015-08-28 MED ORDER — PREDNISONE 10 MG PO TABS: 50.0000 mg | ORAL_TABLET | Freq: Every day | ORAL | Status: DC

## 2015-08-28 MED ORDER — IPRATROPIUM-ALBUTEROL 0.5-2.5 (3) MG/3ML IN SOLN
3.0000 mL | Freq: Once | RESPIRATORY_TRACT | Status: AC
  Administered 2015-08-28: 3 mL via RESPIRATORY_TRACT
  Filled 2015-08-28: qty 3

## 2015-08-28 MED ORDER — GUAIFENESIN-CODEINE 100-10 MG/5ML PO SOLN: 10.0000 mL | Freq: Three times a day (TID) | ORAL | Status: DC | PRN

## 2015-08-28 MED ORDER — PREDNISONE 20 MG PO TABS
60.0000 mg | ORAL_TABLET | Freq: Once | ORAL | Status: AC
  Administered 2015-08-28: 60 mg via ORAL
  Filled 2015-08-28: qty 3

## 2015-08-28 MED ORDER — SODIUM CHLORIDE 0.9 % IV BOLUS (SEPSIS)
1000.0000 mL | Freq: Once | INTRAVENOUS | Status: AC
  Administered 2015-08-28: 1000 mL via INTRAVENOUS

## 2015-08-28 MED ORDER — IBUPROFEN 600 MG PO TABS
600.0000 mg | ORAL_TABLET | Freq: Once | ORAL | Status: AC
  Administered 2015-08-28: 600 mg via ORAL
  Filled 2015-08-28: qty 1

## 2015-08-28 NOTE — ED Provider Notes (Signed)
[facility] Emergency Department Provider Note ____________________________________________  Time seen: Approximately 4:29 PM  I have reviewed the triage vital signs and the nursing notes.   HISTORY  Chief Complaint Cough  HPI Terry Pruitt is a 54 y.o. male is here with complaint of cough, fever and chills for 2 days.Patient states he has not taken any over-the-counter medication other than Tylenol. He continues to use his albuterol inhaler which does not seem to be helping. He admits he continues to smoke one pack cigarettes per day. He denies any vomiting or diarrhea. He denies any sore throat or earache. Patient has decreased appetite since being sick.   Past Medical History  Diagnosis Date  . Diabetes mellitus without complication (HCC)     There are no active problems to display for this patient.   History reviewed. No pertinent past surgical history.  No current outpatient prescriptions on file.  Allergies Review of patient's allergies indicates no known allergies.  No family history on file.  Social History Social History  Substance Use Topics  . Smoking status: Current Every Day Smoker -- 1.00 packs/day    Types: Cigarettes  . Smokeless tobacco: None  . Alcohol Use: No    Review of Systems Constitutional: Positive fever/chills ENT: No sore throat. Cardiovascular: Denies chest pain. Respiratory: Denies shortness of breath. Positive cough Gastrointestinal: No abdominal pain.  No nausea, no vomiting.  No diarrhea.  No constipation. Genitourinary: Negative for dysuria. Musculoskeletal: Negative for back pain. Skin: Negative for rash. Neurological: Negative for headaches, focal weakness or numbness.  10-point ROS otherwise negative.  ____________________________________________   PHYSICAL EXAM:  VITAL SIGNS: ED Triage Vitals  Enc Vitals Group     BP 08/28/15 1518 151/80 mmHg     Pulse Rate 08/28/15 1518 124     Resp 08/28/15  1518 20     Temp 08/28/15 1518 101.6 F (38.7 C)     Temp Source 08/28/15 1518 Oral     SpO2 08/28/15 1518 94 %     Weight 08/28/15 1518 184 lb (83.462 kg)     Height 08/28/15 1518 6' (1.829 m)     Head Cir --      Peak Flow --      Pain Score 08/28/15 1522 9     Pain Loc --      Pain Edu? --      Excl. in GC? --     Constitutional: Alert and oriented. Well appearing and in no acute distress. Patient is sleeping in the room but arousable to answer questions. Eyes: Conjunctivae are normal. PERRL. EOMI. Head: Atraumatic. Nose: No congestion/rhinnorhea. Mouth/Throat: Mucous membranes slightly dry.  Oropharynx non-erythematous. Neck: No stridor.  Supple Hematological/Lymphatic/Immunilogical: No cervical lymphadenopathy. Cardiovascular: Normal rate, regular rhythm. Grossly normal heart sounds.  Good peripheral circulation. Respiratory: Normal respiratory effort.  No retractions. Lungs coarse cough with no wheezing noted. Poor air exchange at present. Gastrointestinal: Soft and nontender. No distention.  Musculoskeletal: No lower extremity tenderness nor edema.  No joint effusions. Neurologic:  Normal speech and language. No gross focal neurologic deficits are appreciated. No gait instability. Skin:  Skin is warm, dry and intact. No rash noted. Psychiatric: Mood and affect are normal. Speech and behavior are normal.  ____________________________________________   LABS (all labs ordered are listed, but only abnormal results are displayed)  Labs Reviewed - No data to display  RADIOLOGY Chest x-ray per radiologist shows findings consistent with viral pneumonitis  ____________________________________________   PROCEDURES  Procedure(s)  performed: None  Critical Care performed: No  ____________________________________________   INITIAL IMPRESSION / ASSESSMENT AND PLAN / ED COURSE  Pertinent labs & imaging results that were available during my care of the patient were  reviewed by me and considered in my medical decision making (see chart for details).  Lab work was pending for influenza test. Patient is getting normal saline at present. Patient's had 2 nebulizer treatments which he states has helped with his breathing. At present chest x-ray shows a viral pneumonitis. Patient was transferred to [facility] F[facility] at 1940 ____________________________________________   FINAL CLINICAL IMPRESSION(S) / ED DIAGNOSES  Final diagnoses:  None      Tommi Rumps, PA-C 08/28/15 1940  Gayla Doss, MD 08/28/15 2121

## 2015-08-28 NOTE — ED Notes (Signed)
Cough and chills x 2 days.  

## 2017-03-26 ENCOUNTER — Encounter: Payer: Self-pay | Admitting: Emergency Medicine

## 2017-03-26 ENCOUNTER — Emergency Department: Payer: Self-pay

## 2017-03-26 ENCOUNTER — Emergency Department
Admission: EM | Admit: 2017-03-26 | Discharge: 2017-03-26 | Disposition: A | Discharge status: Home / Self Care | Payer: Self-pay | Attending: Emergency Medicine | Admitting: Emergency Medicine

## 2017-03-26 DIAGNOSIS — J209 Acute bronchitis, unspecified: Secondary | ICD-10-CM

## 2017-03-26 DIAGNOSIS — E119 Type 2 diabetes mellitus without complications: Secondary | ICD-10-CM | POA: Insufficient documentation

## 2017-03-26 DIAGNOSIS — R0602 Shortness of breath: Secondary | ICD-10-CM | POA: Insufficient documentation

## 2017-03-26 DIAGNOSIS — J9801 Acute bronchospasm: Secondary | ICD-10-CM | POA: Insufficient documentation

## 2017-03-26 DIAGNOSIS — F17210 Nicotine dependence, cigarettes, uncomplicated: Secondary | ICD-10-CM | POA: Insufficient documentation

## 2017-03-26 LAB — COMPREHENSIVE METABOLIC PANEL
ALT: 18 U/L (ref 17–63)
ANION GAP: 9 (ref 5–15)
AST: 20 U/L (ref 15–41)
Albumin: 4 g/dL (ref 3.5–5.0)
Alkaline Phosphatase: 74 U/L (ref 38–126)
BILIRUBIN TOTAL: 0.3 mg/dL (ref 0.3–1.2)
BUN: 18 mg/dL (ref 6–20)
CO2: 26 mmol/L (ref 22–32)
Calcium: 9.6 mg/dL (ref 8.9–10.3)
Chloride: 104 mmol/L (ref 101–111)
Creatinine, Ser: 0.81 mg/dL (ref 0.61–1.24)
Glucose, Bld: 119 mg/dL — ABNORMAL HIGH (ref 65–99)
POTASSIUM: 4.5 mmol/L (ref 3.5–5.1)
Sodium: 139 mmol/L (ref 135–145)
TOTAL PROTEIN: 7.5 g/dL (ref 6.5–8.1)

## 2017-03-26 LAB — CBC
HCT: 41.2 % (ref 40.0–52.0)
Hemoglobin: 14.2 g/dL (ref 13.0–18.0)
MCH: 31.9 pg (ref 26.0–34.0)
MCHC: 34.4 g/dL (ref 32.0–36.0)
MCV: 92.8 fL (ref 80.0–100.0)
PLATELETS: 249 10*3/uL (ref 150–440)
RBC: 4.44 MIL/uL (ref 4.40–5.90)
RDW: 13.4 % (ref 11.5–14.5)
WBC: 9.8 10*3/uL (ref 3.8–10.6)

## 2017-03-26 LAB — TROPONIN I: Troponin I: <0.03 ng/mL (ref <0.03)

## 2017-03-26 IMAGING — CR DG CHEST 2V
2 series · 2 of 2 positions shown · non-contrast
Comparison: 08/28/2015

CLINICAL DATA: Shortness of breath with exertion

EXAM:
CHEST  2 VIEW

[chest pa]
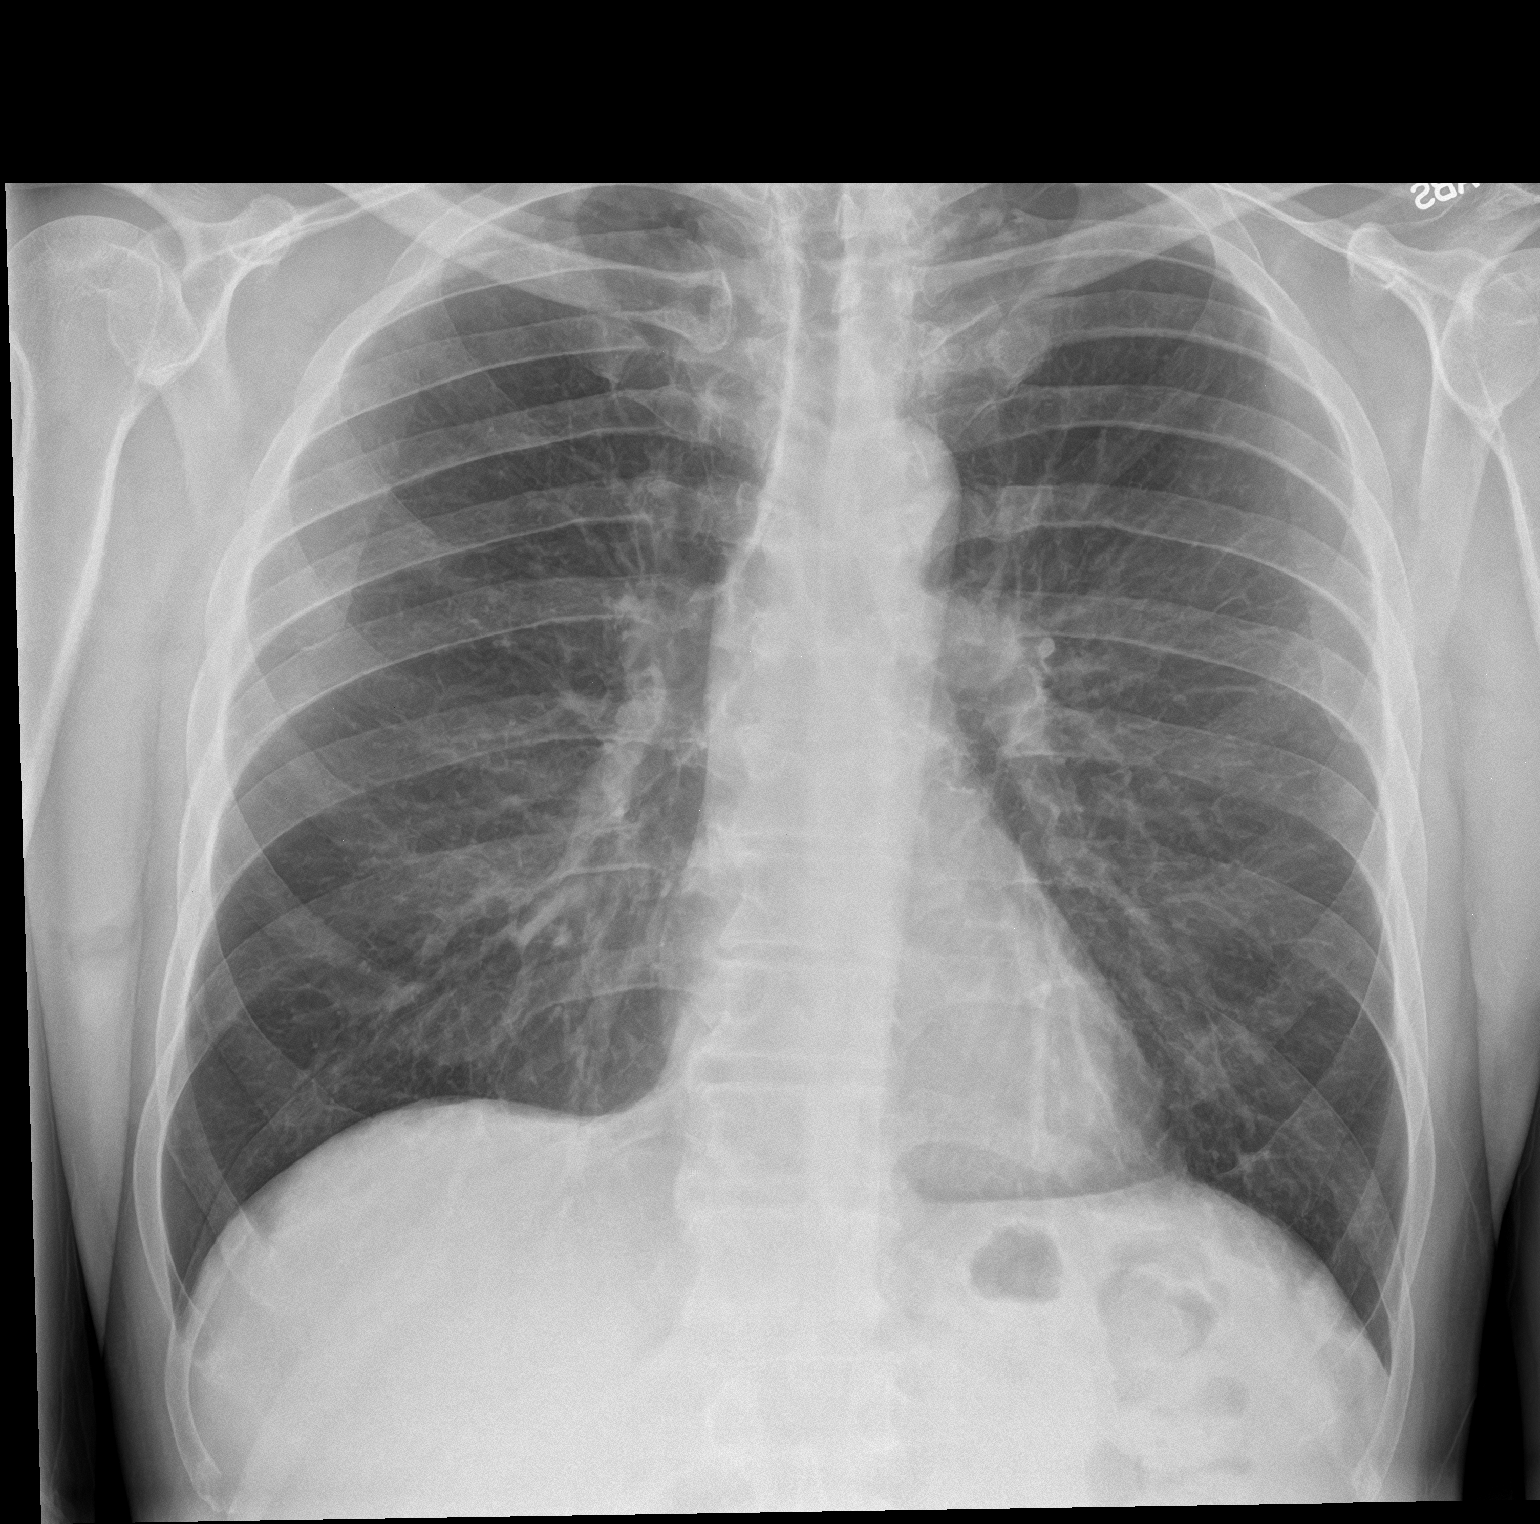

[chest lat]
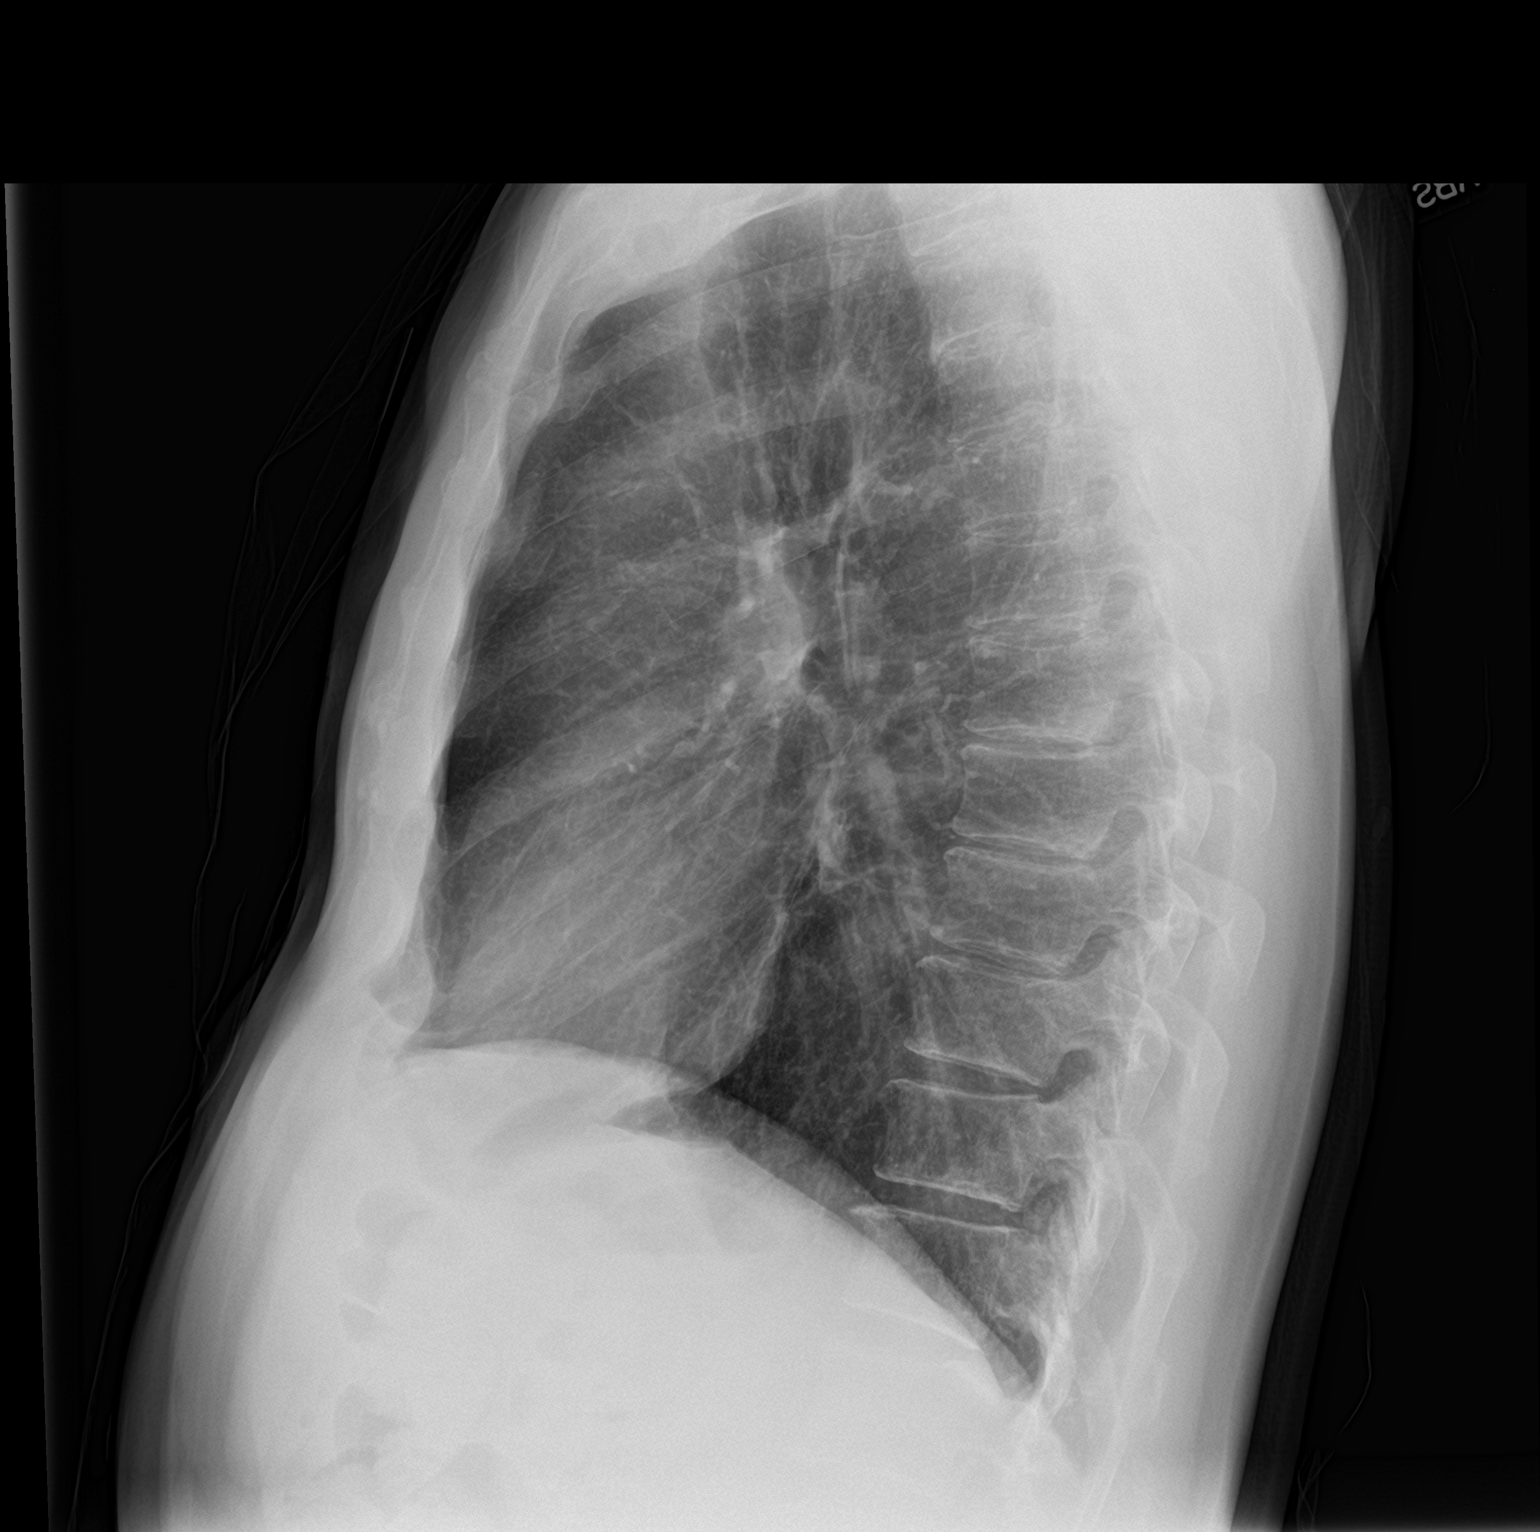

[2 of 2 positions shown; findings below may reference images not displayed]

FINDINGS: Normal heart size and mediastinal contours. No acute infiltrate or
edema. Stable biapical pleural thickening. No effusion or
pneumothorax. No acute osseous findings.
IMPRESSION: No evidence of active disease.

## 2017-03-26 MED ORDER — METHYLPREDNISOLONE SODIUM SUCC 125 MG IJ SOLR
125.0000 mg | Freq: Once | INTRAMUSCULAR | Status: AC
  Administered 2017-03-26: 125 mg via INTRAVENOUS
  Filled 2017-03-26: qty 2

## 2017-03-26 MED ORDER — ALBUTEROL SULFATE HFA 108 (90 BASE) MCG/ACT IN AERS: 2.0000 | INHALATION_SPRAY | Freq: Four times a day (QID) | RESPIRATORY_TRACT | 2 refills | Status: DC | PRN

## 2017-03-26 MED ORDER — PREDNISONE 10 MG (21) PO TBPK: ORAL_TABLET | Freq: Every day | ORAL | 0 refills | Status: DC

## 2017-03-26 MED ORDER — IPRATROPIUM-ALBUTEROL 0.5-2.5 (3) MG/3ML IN SOLN
3.0000 mL | Freq: Once | RESPIRATORY_TRACT | Status: AC
  Administered 2017-03-26: 3 mL via RESPIRATORY_TRACT
  Filled 2017-03-26: qty 3

## 2017-03-26 NOTE — ED Notes (Signed)
Paper copy of discharge consent signed and placed on paper chart.

## 2017-03-26 NOTE — ED Provider Notes (Signed)
[facility] Emergency Department Provider Note       Time seen: ----------------------------------------- 3:14 PM on 03/26/2017 -----------------------------------------     I have reviewed the triage vital signs and the nursing notes.   HISTORY   Chief Complaint Shortness of Breath    HPI Terry Pruitt is a 56 y.o. male who presents to the ED for shortness of breath with exertion for the last 3 days. Patient denies any chest pain, states this shortness of breath is unusual for him. Patient reports the symptoms started after inhaling a strong chemical fumes without any mask or protection. Patient reports a smoking history since age 56, he used to smoke marijuana heavily but quit 20 years ago. He denies fevers, chills or other complaints.   Past Medical History:  Diagnosis Date  . Diabetes mellitus without complication (HCC)     There are no active problems to display for this patient.   History reviewed. No pertinent surgical history.  Allergies Patient has no known allergies.  Social History Social History  Substance Use Topics  . Smoking status: Current Every Day Smoker    Packs/day: 1.00    Types: Cigarettes  . Smokeless tobacco: Not on file  . Alcohol use No    Review of Systems Constitutional: Negative for fever. Cardiovascular: Negative for chest pain. Respiratory: Positive shortness of breath Gastrointestinal: Negative for abdominal pain, vomiting and diarrhea. Genitourinary: Negative for dysuria. Musculoskeletal: Negative for back pain. Skin: Negative for rash. Neurological: Negative for headaches, focal weakness or numbness.  All systems negative/normal/unremarkable except as stated in the HPI  ____________________________________________   PHYSICAL EXAM:  VITAL SIGNS: ED Triage Vitals  Enc Vitals Group     BP 03/26/17 1223 (!) 137/100     Pulse Rate 03/26/17 1223 (!) 109     Resp 03/26/17 1223 20     Temp 03/26/17  1223 98.6 F (37 C)     Temp src --      SpO2 03/26/17 1223 100 %     Weight 03/26/17 1226 174 lb (78.9 kg)     Height 03/26/17 1226 6' (1.829 m)     Head Circumference --      Peak Flow --      Pain Score 03/26/17 1223 0     Pain Loc --      Pain Edu? --      Excl. in GC? --     Constitutional: Alert and oriented. Well appearing and in no distress. Eyes: Conjunctivae are normal. Normal extraocular movements. ENT   Head: Normocephalic and atraumatic.   Nose: No congestion/rhinnorhea.   Mouth/Throat: Mucous membranes are moist.   Neck: No stridor. Cardiovascular: Normal rate, regular rhythm. No murmurs, rubs, or gallops. Respiratory: Normal respiratory effort without tachypnea nor retractions. Breath sounds are clear and equal bilaterally. Mild expiratory wheezing with coughing or forced expiration Gastrointestinal: Soft and nontender. Normal bowel sounds Musculoskeletal: Nontender with normal range of motion in extremities. No lower extremity tenderness nor edema. Neurologic:  Normal speech and language. No gross focal neurologic deficits are appreciated.  Skin:  Skin is warm, dry and intact. No rash noted. Psychiatric: Mood and affect are normal. Speech and behavior are normal.  ____________________________________________  EKG: Interpreted by me. Sinus rhythm with premature ventricular complexes, rate is 95 bpm, normal PR interval, normal QRS, normal QT.  ____________________________________________  ED COURSE:  Pertinent labs & imaging results that were available during my care of the patient were reviewed by me and  considered in my medical decision making (see chart for details). Patient presents for shortness of breath, we will assess with labs and imaging as indicated.   Procedures ____________________________________________   LABS (pertinent positives/negatives)  Labs Reviewed  COMPREHENSIVE METABOLIC PANEL - Abnormal; Notable for the following:        Result Value   Glucose, Bld 119 (*)    All other components within normal limits  CBC  TROPONIN I    RADIOLOGY  Chest x-ray is normal  ____________________________________________  FINAL ASSESSMENT AND PLAN  Shortness of breath, bronchospasm  Plan: Patient's labs and imaging were dictated above. Patient had presented for shortness of breath likely secondary to inhaled chemical and bronchospasm. He still has some wheezing here that he states is improving over the past several days. Given a DuoNeb and steroids. We'll continue him on albuterol treatments and prednisone. I will refer him for outpatient follow-up.   Emily FilbertWilliams, Jonathan E, MD   Note: This note was generated in part or whole with voice recognition software. Voice recognition is usually quite accurate but there are transcription errors that can and very often do occur. I apologize for any typographical errors that were not detected and corrected.     Emily FilbertWilliams, Jonathan E, MD 03/26/17 [phone]

## 2017-03-26 NOTE — ED Triage Notes (Signed)
States SOB with exertion x 3 days. Denies chest pain. States this is unusual for him. Speaking full sentences in triage.

## 2017-04-03 ENCOUNTER — Encounter: Payer: Self-pay | Admitting: *Deleted

## 2017-04-03 ENCOUNTER — Emergency Department: Payer: Self-pay

## 2017-04-03 ENCOUNTER — Emergency Department
Admission: EM | Admit: 2017-04-03 | Discharge: 2017-04-03 | Disposition: A | Discharge status: Home / Self Care | Payer: Self-pay | Attending: Emergency Medicine | Admitting: Emergency Medicine

## 2017-04-03 DIAGNOSIS — E119 Type 2 diabetes mellitus without complications: Secondary | ICD-10-CM | POA: Insufficient documentation

## 2017-04-03 DIAGNOSIS — R319 Hematuria, unspecified: Secondary | ICD-10-CM | POA: Insufficient documentation

## 2017-04-03 DIAGNOSIS — N201 Calculus of ureter: Secondary | ICD-10-CM | POA: Insufficient documentation

## 2017-04-03 DIAGNOSIS — N23 Unspecified renal colic: Secondary | ICD-10-CM | POA: Insufficient documentation

## 2017-04-03 DIAGNOSIS — Z79899 Other long term (current) drug therapy: Secondary | ICD-10-CM | POA: Insufficient documentation

## 2017-04-03 DIAGNOSIS — F17210 Nicotine dependence, cigarettes, uncomplicated: Secondary | ICD-10-CM | POA: Insufficient documentation

## 2017-04-03 LAB — URINALYSIS, COMPLETE (UACMP) WITH MICROSCOPIC
Bacteria, UA: NONE SEEN
Bilirubin Urine: NEGATIVE
GLUCOSE, UA: NEGATIVE mg/dL
Ketones, ur: NEGATIVE mg/dL
Leukocytes, UA: NEGATIVE
NITRITE: NEGATIVE
PH: 6 (ref 5.0–8.0)
Protein, ur: 30 mg/dL — AB
Specific Gravity, Urine: 1.02 (ref 1.005–1.030)
Squamous Epithelial / LPF: NONE SEEN

## 2017-04-03 IMAGING — CT CT RENAL STONE PROTOCOL
2 of 4 series · 15 of 46 positions shown, 17 images · non-contrast
Comparison: No priors.

CLINICAL DATA: 56-year-old male complaining of right-sided flank
pain for 1 day. In acutely worsening over the past 2 hours.

EXAM:
CT ABDOMEN AND PELVIS WITHOUT CONTRAST
TECHNIQUE: Multidetector CT imaging of the abdomen and pelvis was performed
following the standard protocol without IV contrast.

[Series 2: stone full standard · axial · 0.73mm/px · z∈[-960,-500]mm · 12 of 102 slices shown, 14 images]
[im 5/102  soft-tissue]
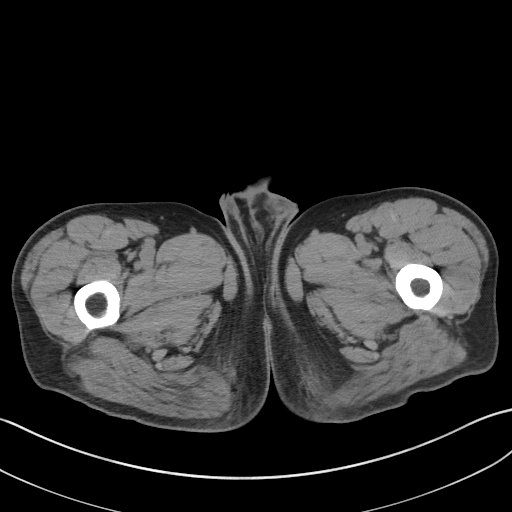
[im 5/102  bone]
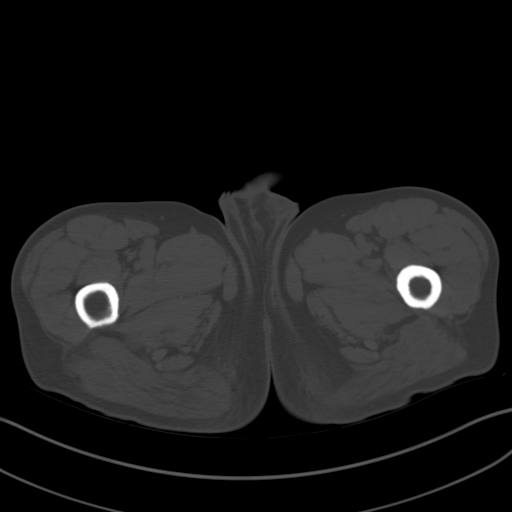
[im 13/102  soft-tissue]
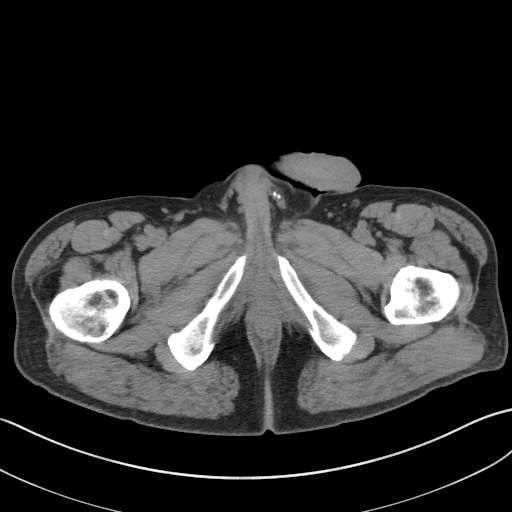
[im 22/102  soft-tissue]
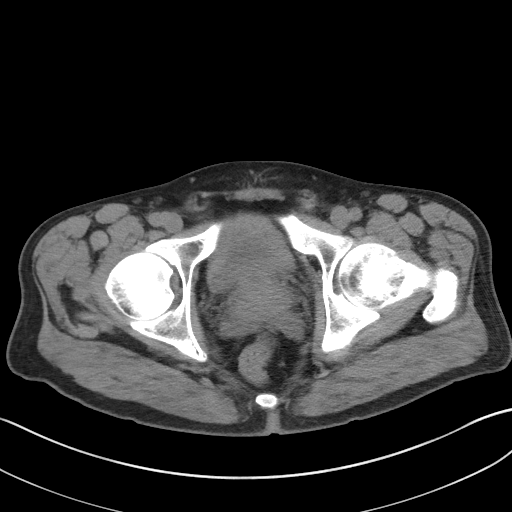
[im 30/102  soft-tissue]
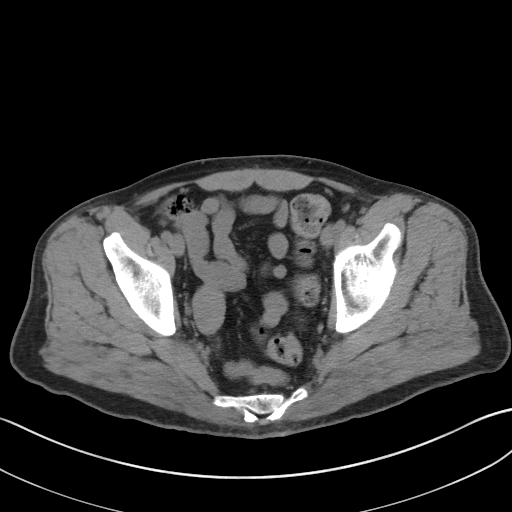
[im 38/102  soft-tissue]
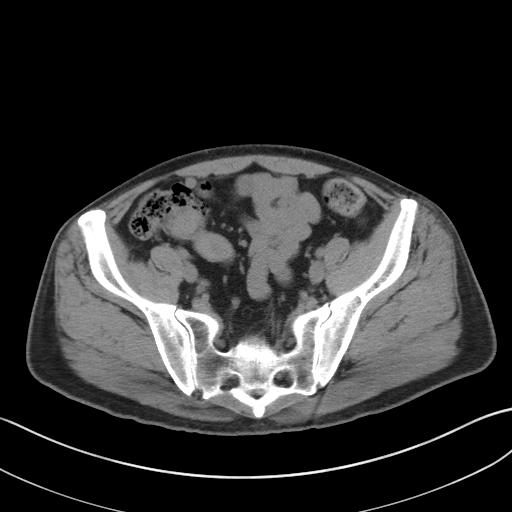
[im 47/102  soft-tissue]
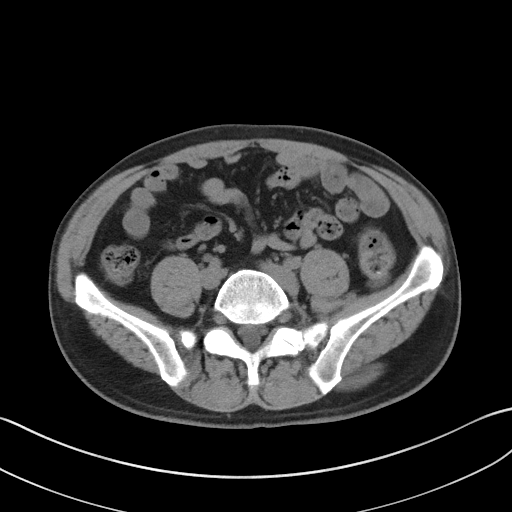
[im 55/102  soft-tissue]
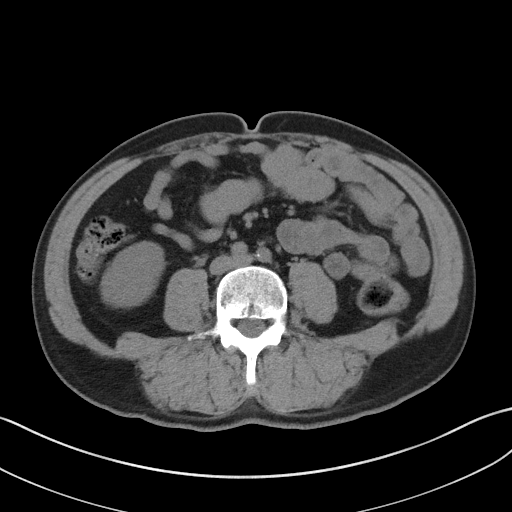
[im 64/102  soft-tissue]
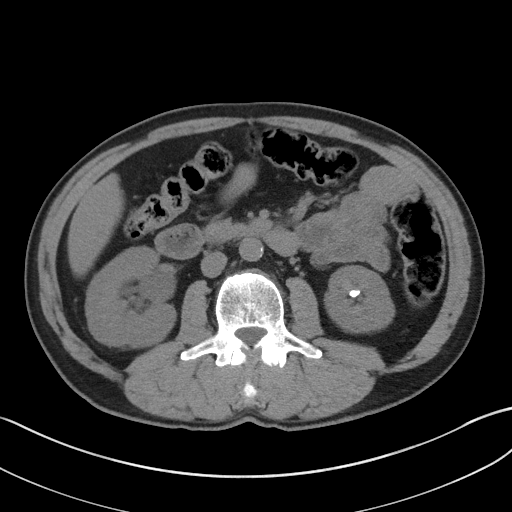
[im 72/102  soft-tissue]
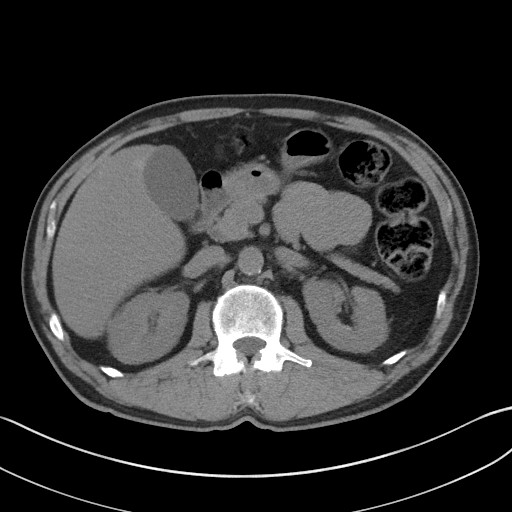
[im 72/102  bone]
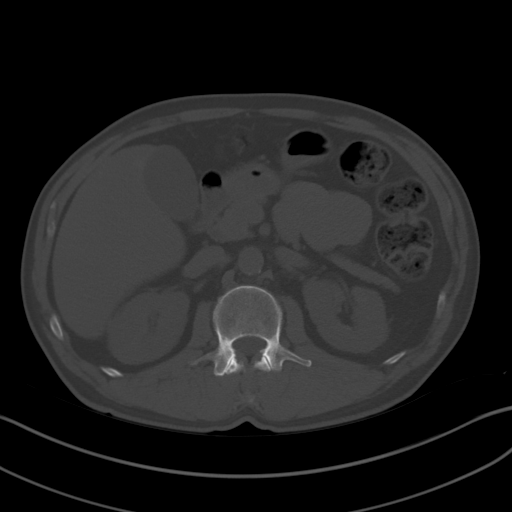
[im 80/102  soft-tissue]
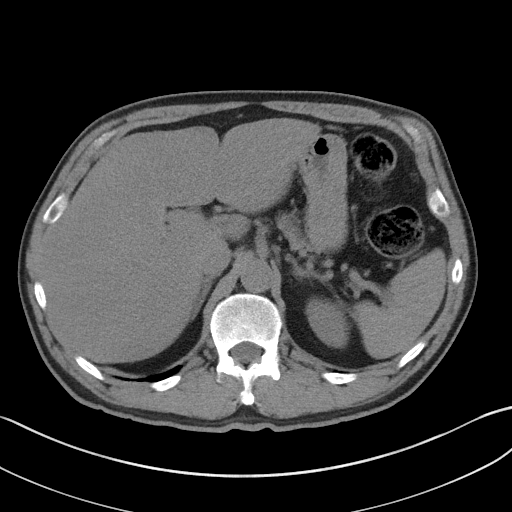
[im 89/102  soft-tissue]
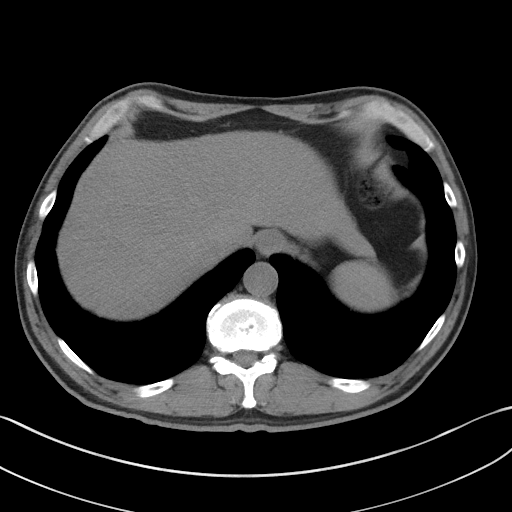
[im 97/102  soft-tissue]
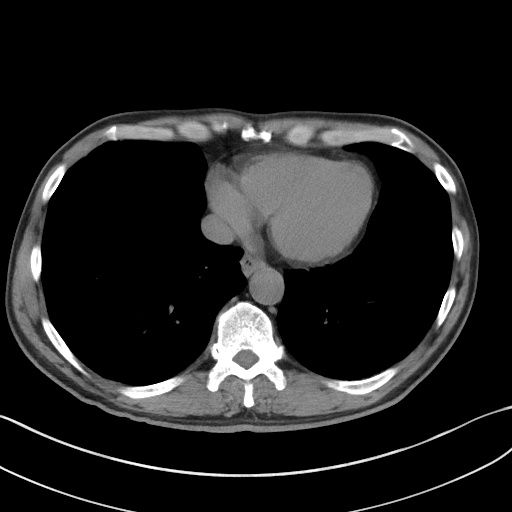

[Series 5: coronal · coronal · 0.74mm/px · 3 of 146 slices shown]
[im 49/146  soft-tissue]
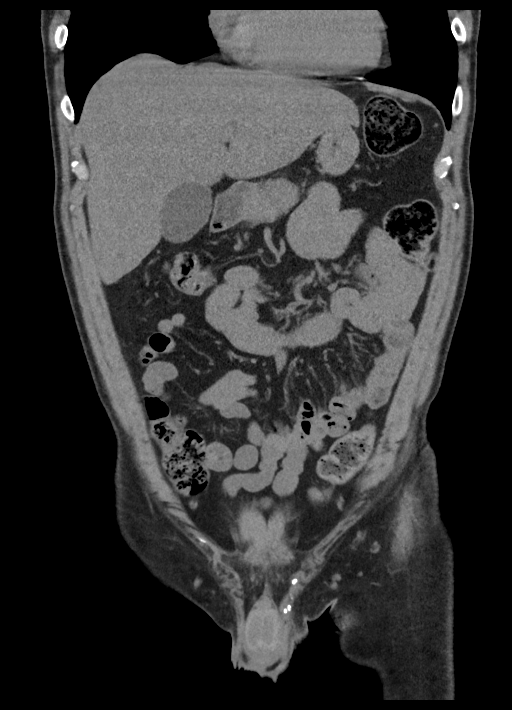
[im 65/146  soft-tissue]
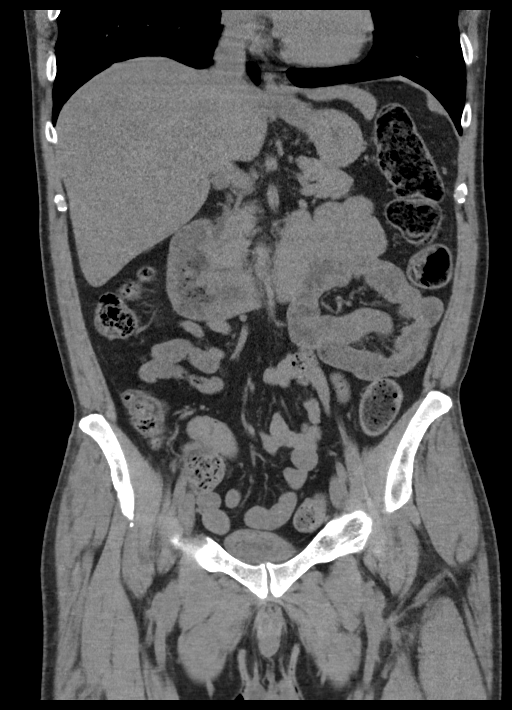
[im 81/146  soft-tissue]
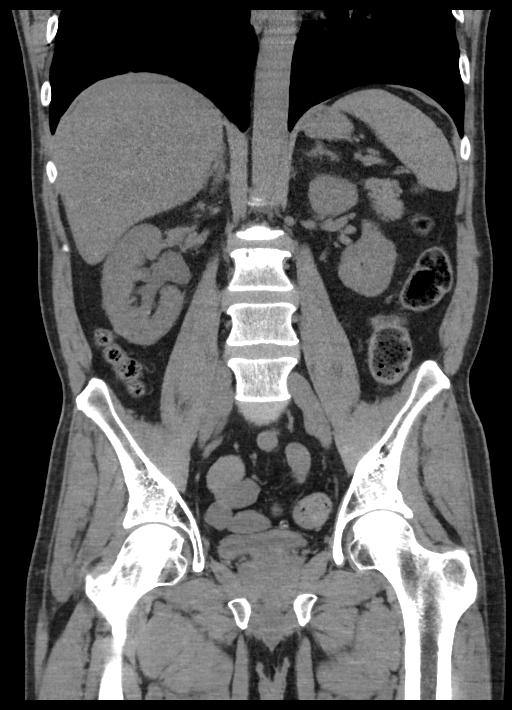

[15 of 46 positions shown; findings below may reference images not displayed]

FINDINGS: Lower chest: Mild dependent scarring in the lung bases.

Hepatobiliary: Heterogeneous low attenuation throughout the hepatic
parenchyma, indicative of hepatic steatosis. No discrete cystic or
solid hepatic lesions are confidently identified on today's
noncontrast CT examination. Unenhanced appearance of the gallbladder
is normal.

Pancreas: No definite pancreatic mass or peripancreatic inflammatory
changes are noted on today's noncontrast CT examination.

Spleen: Unremarkable.

Adrenals/Urinary Tract: 4 mm calculus at the level of the right
ureterovesicular junction (axial image 80 of series 2) with mild
proximal right hydroureteronephrosis. Multiple additional
nonobstructive calculi are noted within both renal collecting
systems, measuring up to 9 mm in the lower pole collecting system of
the left kidney. Unenhanced appearance of the kidneys is otherwise
unremarkable. Urinary bladder is normal in appearance. Bilateral
adrenal glands are normal in appearance.

Stomach/Bowel: Normal appearance of the stomach. No pathologic
dilatation of small bowel or colon. Normal appendix.

Vascular/Lymphatic: Atherosclerotic calcifications in the th
abdominal aorta, without evidence of aneurysm. No lymphadenopathy
noted in the abdomen.

Reproductive: Prostate gland and seminal vesicles are unremarkable
in appearance.

Other: No significant volume of ascites.  No pneumoperitoneum.

Musculoskeletal: There are no aggressive appearing lytic or blastic
lesions noted in the visualized portions of the skeleton.
IMPRESSION: 1. 4 mm calculus at the right ureterovesicular junction with mild
proximal right hydroureteronephrosis.
2. Multiple additional nonobstructive calculi are noted within both
renal collecting systems (left greater than right), the largest of
which measure up to 9 mm in the lower pole collecting system of left
kidney.
3. Heterogeneous hepatic steatosis.
4. Aortic atherosclerosis.
Aortic Atherosclerosis ([id number].I).

## 2017-04-03 MED ORDER — ONDANSETRON 4 MG PO TBDP
8.0000 mg | ORAL_TABLET | Freq: Once | ORAL | Status: AC
  Administered 2017-04-03: 8 mg via ORAL
  Filled 2017-04-03: qty 2

## 2017-04-03 MED ORDER — TAMSULOSIN HCL 0.4 MG PO CAPS
0.4000 mg | ORAL_CAPSULE | Freq: Every day | ORAL | 0 refills | Status: DC
Start: 2017-04-03 — End: 2020-03-25

## 2017-04-03 MED ORDER — KETOROLAC TROMETHAMINE 10 MG PO TABS: 10.0000 mg | ORAL_TABLET | Freq: Four times a day (QID) | ORAL | 0 refills | Status: DC | PRN

## 2017-04-03 MED ORDER — KETOROLAC TROMETHAMINE 60 MG/2ML IM SOLN
15.0000 mg | Freq: Once | INTRAMUSCULAR | Status: AC
  Administered 2017-04-03: 15 mg via INTRAMUSCULAR
  Filled 2017-04-03: qty 2

## 2017-04-03 MED ORDER — ONDANSETRON 8 MG PO TBDP: 8.0000 mg | ORAL_TABLET | Freq: Three times a day (TID) | ORAL | 0 refills | Status: DC | PRN

## 2017-04-03 NOTE — ED Triage Notes (Signed)
First Nurse Note:  C/O right flank pain x 1 day.  Pain initially started yesterday morning, but improved and resolved.  Today felt pain most of the day, but over the past 2 hours pain worsened.

## 2017-04-03 NOTE — ED Triage Notes (Signed)
Pt has right flank pain since last night.  Pt states pain worse today.  Pt has n/v today.  Hx of kidney stones   Pt alert

## 2017-04-03 NOTE — Discharge Instructions (Signed)
You have a 4mm stone just outside the bladder on the right side. Take medications as prescribed until this passes. Follow up with urology in 1 week if your symptoms have not resolved.

## 2017-04-03 NOTE — ED Provider Notes (Signed)
[facility] Emergency Department Provider Note  ____________________________________________  Time seen: Approximately 6:43 PM  I have reviewed the triage vital signs and the nursing notes.   HISTORY  Chief Complaint Flank Pain    HPI Terry Pruitt is a 56 y.o. male who complains of right flank pain radiating around to the suprapubic area that started last night. Initially was very gradual and mild, but has worsened in a colicky fashion, waxing and waning over the past 24 hours or so. No aggravating or alleviating factors. No trauma. No dysuria frequency urgency hematuria. Feels like previous kidney stones. Denies any fever or chills, but has decreased appetite today. Pain is sharp and severe currently     Past Medical History:  Diagnosis Date  . Diabetes mellitus without complication (HCC)   Kidney stones   There are no active problems to display for this patient.    No past surgical history on file.   Prior to Admission medications   Medication Sig Start Date End Date Taking? Authorizing Provider  albuterol (PROVENTIL HFA;VENTOLIN HFA) 108 (90 Base) MCG/ACT inhaler Inhale 2 puffs into the lungs every 6 (six) hours as needed for wheezing or shortness of breath. 08/28/15   Triplett, Rulon Eisenmengerari B, FNP  albuterol (PROVENTIL HFA;VENTOLIN HFA) 108 (90 Base) MCG/ACT inhaler Inhale 2 puffs into the lungs every 6 (six) hours as needed for wheezing or shortness of breath. 03/26/17   Emily FilbertWilliams, Jonathan E, MD  azithromycin (ZITHROMAX) 250 MG tablet 2 tablets today, then 1 tablet for the next 4 days. 08/28/15   Triplett, Cari B, FNP  guaiFENesin-codeine 100-10 MG/5ML syrup Take 10 mLs by mouth 3 (three) times daily as needed. 08/28/15   Triplett, Rulon Eisenmengerari B, FNP  ketorolac (TORADOL) 10 MG tablet Take 1 tablet (10 mg total) by mouth every 6 (six) hours as needed for moderate pain. 04/03/17   Sharman CheekStafford, Markesha Hannig, MD  ondansetron (ZOFRAN ODT) 8 MG disintegrating tablet Take 1 tablet  (8 mg total) by mouth every 8 (eight) hours as needed for nausea or vomiting. 04/03/17   Sharman CheekStafford, Emojean Gertz, MD  predniSONE (DELTASONE) 10 MG tablet Take 5 tablets (50 mg total) by mouth daily. 08/28/15   Triplett, Cari B, FNP  predniSONE (STERAPRED UNI-PAK 21 TAB) 10 MG (21) TBPK tablet Take by mouth daily. Dispense steroid taper pack as directed 03/26/17   Emily FilbertWilliams, Jonathan E, MD  tamsulosin (FLOMAX) 0.4 MG CAPS capsule Take 1 capsule (0.4 mg total) by mouth daily. 04/03/17   Sharman CheekStafford, Arneda Sappington, MD     Allergies Patient has no known allergies.   No family history on file.  Social History Social History  Substance Use Topics  . Smoking status: Current Every Day Smoker    Packs/day: 1.00    Types: Cigarettes  . Smokeless tobacco: Never Used  . Alcohol use No    Review of Systems  Constitutional:   No fever or chills.  ENT:   No sore throat. No rhinorrhea. Cardiovascular:   No chest pain or syncope. Respiratory:   No dyspnea or cough. Gastrointestinal:   Right flank pain as above without vomiting and diarrhea.  Musculoskeletal:   Negative for focal pain or swelling All other systems reviewed and are negative except as documented above in ROS and HPI.  ____________________________________________   PHYSICAL EXAM:  VITAL SIGNS: ED Triage Vitals  Enc Vitals Group     BP 04/03/17 1821 (!) 170/82     Pulse Rate 04/03/17 1821 86     Resp 04/03/17  1821 18     Temp 04/03/17 1821 98.3 F (36.8 C)     Temp Source 04/03/17 1821 Oral     SpO2 04/03/17 1821 99 %     Weight 04/03/17 1823 175 lb (79.4 kg)     Height 04/03/17 1823 6' (1.829 m)     Head Circumference --      Peak Flow --      Pain Score 04/03/17 1821 10     Pain Loc --      Pain Edu? --      Excl. in GC? --     Vital signs reviewed, nursing assessments reviewed.   Constitutional:   Alert and oriented. Uncomfortable but not in distress. Eyes:   No scleral icterus.  EOMI. No nystagmus. No conjunctival pallor.  PERRL. ENT   Head:   Normocephalic and atraumatic.   Nose:   No congestion/rhinnorhea.    Mouth/Throat:   MMM, no pharyngeal erythema. No peritonsillar mass.    Neck:   No meningismus. Full ROM Hematological/Lymphatic/Immunilogical:   No cervical lymphadenopathy. Cardiovascular:   RRR. Symmetric bilateral radial and DP pulses.  No murmurs.  Respiratory:   Normal respiratory effort without tachypnea/retractions. Breath sounds are clear and equal bilaterally. No wheezes/rales/rhonchi. Gastrointestinal:   Soft with right lower quadrant tenderness, no focal tenderness at McBurney's point. Non distended. There is no CVA tenderness.  No rebound, rigidity, or guarding. Genitourinary:   deferred Musculoskeletal:   Normal range of motion in all extremities. No joint effusions.  No lower extremity tenderness.  No edema. Neurologic:   Normal speech and language.  Motor grossly intact. No gross focal neurologic deficits are appreciated.  Skin:    Skin is warm, dry and intact. No rash noted.  No petechiae, purpura, or bullae.  ____________________________________________    LABS (pertinent positives/negatives) (all labs ordered are listed, but only abnormal results are displayed) Labs Reviewed  URINALYSIS, COMPLETE (UACMP) WITH MICROSCOPIC - Abnormal; Notable for the following:       Result Value   Color, Urine YELLOW (*)    APPearance CLEAR (*)    Hgb urine dipstick LARGE (*)    Protein, ur 30 (*)    All other components within normal limits   ____________________________________________   EKG    ____________________________________________    RADIOLOGY  Ct Renal Stone Study  Result Date: 04/03/2017 CLINICAL DATA:  56 year old male complaining of right-sided flank pain for 1 day. In acutely worsening over the past 2 hours. EXAM: CT ABDOMEN AND PELVIS WITHOUT CONTRAST TECHNIQUE: Multidetector CT imaging of the abdomen and pelvis was performed following the standard  protocol without IV contrast. COMPARISON:  No priors. FINDINGS: Lower chest: Mild dependent scarring in the lung bases. Hepatobiliary: Heterogeneous low attenuation throughout the hepatic parenchyma, indicative of hepatic steatosis. No discrete cystic or solid hepatic lesions are confidently identified on today's noncontrast CT examination. Unenhanced appearance of the gallbladder is normal. Pancreas: No definite pancreatic mass or peripancreatic inflammatory changes are noted on today's noncontrast CT examination. Spleen: Unremarkable. Adrenals/Urinary Tract: 4 mm calculus at the level of the right ureterovesicular junction (axial image 80 of series 2) with mild proximal right hydroureteronephrosis. Multiple additional nonobstructive calculi are noted within both renal collecting systems, measuring up to 9 mm in the lower pole collecting system of the left kidney. Unenhanced appearance of the kidneys is otherwise unremarkable. Urinary bladder is normal in appearance. Bilateral adrenal glands are normal in appearance. Stomach/Bowel: Normal appearance of the stomach. No pathologic dilatation of  small bowel or colon. Normal appendix. Vascular/Lymphatic: Atherosclerotic calcifications in the th abdominal aorta, without evidence of aneurysm. No lymphadenopathy noted in the abdomen. Reproductive: Prostate gland and seminal vesicles are unremarkable in appearance. Other: No significant volume of ascites.  No pneumoperitoneum. Musculoskeletal: There are no aggressive appearing lytic or blastic lesions noted in the visualized portions of the skeleton. IMPRESSION: 1. 4 mm calculus at the right ureterovesicular junction with mild proximal right hydroureteronephrosis. 2. Multiple additional nonobstructive calculi are noted within both renal collecting systems (left greater than right), the largest of which measure up to 9 mm in the lower pole collecting system of left kidney. 3. Heterogeneous hepatic steatosis. 4. Aortic  atherosclerosis. Aortic Atherosclerosis (ICD10-I70.0). Electronically Signed   By: Trudie Reed M.D.   On: 04/03/2017 19:16    ____________________________________________   PROCEDURES Procedures  ____________________________________________   INITIAL IMPRESSION / ASSESSMENT AND PLAN / ED COURSE  Pertinent labs & imaging results that were available during my care of the patient were reviewed by me and considered in my medical decision making (see chart for details).  Patient not distress, vital signs unremarkable except for elevated systolic blood pressure likely due to pain. Presentation is consistent with renal colic. We'll check urinalysis, check CT scan given the right lower quadrant tenderness although have low suspicion of appendicitis. Toradol and Zofran. Overall patient is well-appearing, suitable for outpatient management unless severe findings are identified or symptoms or uncontrollable and patient is unable to tolerate oral intake.       ----------------------------------------- 7:52 PM on 04/03/2017 -----------------------------------------  Calm and comfortable. Workup reveals 4 mm right UVJ stone, no Bonura-grade obstruction, no evidence of UTI. Not need to draw labs today. Follow-up primary care, symptom control. ____________________________________________   FINAL CLINICAL IMPRESSION(S) / ED DIAGNOSES  Final diagnoses:  Hematuria, unspecified type  Ureterolithiasis  Ureteral colic      New Prescriptions   KETOROLAC (TORADOL) 10 MG TABLET    Take 1 tablet (10 mg total) by mouth every 6 (six) hours as needed for moderate pain.   ONDANSETRON (ZOFRAN ODT) 8 MG DISINTEGRATING TABLET    Take 1 tablet (8 mg total) by mouth every 8 (eight) hours as needed for nausea or vomiting.   TAMSULOSIN (FLOMAX) 0.4 MG CAPS CAPSULE    Take 1 capsule (0.4 mg total) by mouth daily.     Portions of this note were generated with dragon dictation software. Dictation errors  may occur despite best attempts at proofreading.    Sharman Cheek, MD 04/03/17 [phone]

## 2019-06-11 ENCOUNTER — Other Ambulatory Visit: Payer: Self-pay

## 2019-06-11 DIAGNOSIS — Z20822 Contact with and (suspected) exposure to covid-19: Secondary | ICD-10-CM

## 2019-06-13 ENCOUNTER — Ambulatory Visit: Payer: Self-pay

## 2019-06-13 LAB — NOVEL CORONAVIRUS, NAA: SARS-CoV-2, NAA: NOT DETECTED

## 2019-06-13 NOTE — Telephone Encounter (Signed)
Provided covid-19 testing results. Pt. vocies understanding.

## 2019-06-15 ENCOUNTER — Emergency Department
Admission: EM | Admit: 2019-06-15 | Discharge: 2019-06-15 | Disposition: A | Discharge status: Home / Self Care | Payer: Self-pay | Attending: Emergency Medicine | Admitting: Emergency Medicine

## 2019-06-15 ENCOUNTER — Other Ambulatory Visit: Payer: Self-pay

## 2019-06-15 ENCOUNTER — Encounter: Payer: Self-pay | Admitting: Radiology

## 2019-06-15 ENCOUNTER — Emergency Department: Payer: Self-pay

## 2019-06-15 DIAGNOSIS — J029 Acute pharyngitis, unspecified: Secondary | ICD-10-CM | POA: Insufficient documentation

## 2019-06-15 DIAGNOSIS — E119 Type 2 diabetes mellitus without complications: Secondary | ICD-10-CM | POA: Insufficient documentation

## 2019-06-15 DIAGNOSIS — F17210 Nicotine dependence, cigarettes, uncomplicated: Secondary | ICD-10-CM | POA: Insufficient documentation

## 2019-06-15 DIAGNOSIS — Z79899 Other long term (current) drug therapy: Secondary | ICD-10-CM | POA: Insufficient documentation

## 2019-06-15 LAB — CBC WITH DIFFERENTIAL/PLATELET
Abs Immature Granulocytes: 0.04 10*3/uL (ref 0.00–0.07)
Basophils Absolute: 0 10*3/uL (ref 0.0–0.1)
Basophils Relative: 0 %
Eosinophils Absolute: 0.3 10*3/uL (ref 0.0–0.5)
Eosinophils Relative: 3 %
HCT: 37.7 % — ABNORMAL LOW (ref 39.0–52.0)
Hemoglobin: 12.3 g/dL — ABNORMAL LOW (ref 13.0–17.0)
Immature Granulocytes: 0 %
Lymphocytes Relative: 20 %
Lymphs Abs: 2.1 10*3/uL (ref 0.7–4.0)
MCH: 31.3 pg (ref 26.0–34.0)
MCHC: 32.6 g/dL (ref 30.0–36.0)
MCV: 95.9 fL (ref 80.0–100.0)
Monocytes Absolute: 0.8 10*3/uL (ref 0.1–1.0)
Monocytes Relative: 7 %
Neutro Abs: 7.6 10*3/uL (ref 1.7–7.7)
Neutrophils Relative %: 70 %
Platelets: 255 10*3/uL (ref 150–400)
RBC: 3.93 MIL/uL — ABNORMAL LOW (ref 4.22–5.81)
RDW: 12.8 % (ref 11.5–15.5)
WBC: 10.9 10*3/uL — ABNORMAL HIGH (ref 4.0–10.5)
nRBC: 0 % (ref 0.0–0.2)

## 2019-06-15 LAB — COMPREHENSIVE METABOLIC PANEL
ALT: 25 U/L (ref 0–44)
AST: 19 U/L (ref 15–41)
Albumin: 3.9 g/dL (ref 3.5–5.0)
Alkaline Phosphatase: 103 U/L (ref 38–126)
Anion gap: 9 (ref 5–15)
BUN: 21 mg/dL — ABNORMAL HIGH (ref 6–20)
CO2: 26 mmol/L (ref 22–32)
Calcium: 9 mg/dL (ref 8.9–10.3)
Chloride: 103 mmol/L (ref 98–111)
Creatinine, Ser: 1.1 mg/dL (ref 0.61–1.24)
GFR calc Af Amer: >60 mL/min (ref >60)
GFR calc non Af Amer: >60 mL/min (ref >60)
Glucose, Bld: 186 mg/dL — ABNORMAL HIGH (ref 70–99)
Potassium: 4 mmol/L (ref 3.5–5.1)
Sodium: 138 mmol/L (ref 135–145)
Total Bilirubin: 0.4 mg/dL (ref 0.3–1.2)
Total Protein: 7.1 g/dL (ref 6.5–8.1)

## 2019-06-15 LAB — GROUP A STREP BY PCR: Group A Strep by PCR: NOT DETECTED

## 2019-06-15 IMAGING — CT CT NECK W/ CM
3 of 4 series · 9 of 35 positions shown, 10 images · IV contrast (omnipaque)
Comparison: None.

CLINICAL DATA: Initial evaluation for acute sore throat, stridor.

EXAM:
CT NECK WITH CONTRAST
TECHNIQUE: Multidetector CT imaging of the neck was performed using the
standard protocol following the bolus administration of intravenous
contrast.
CONTRAST:  75mL OMNIPAQUE IOHEXOL 300 MG/ML  SOLN

[Series 5: sag neck · sagittal · 0.38mm/px · 5 of 81 slices shown]
[im 27/81  bone]
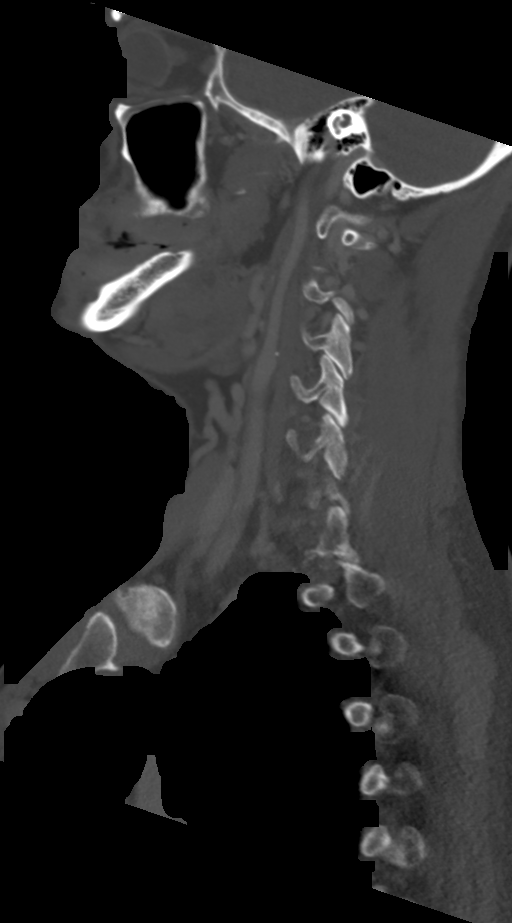
[im 34/81  bone]
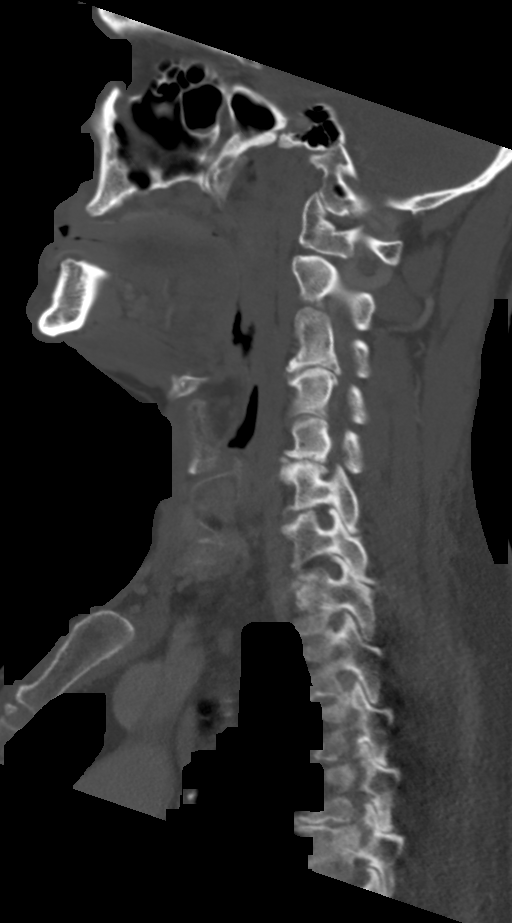
[im 41/81  bone]
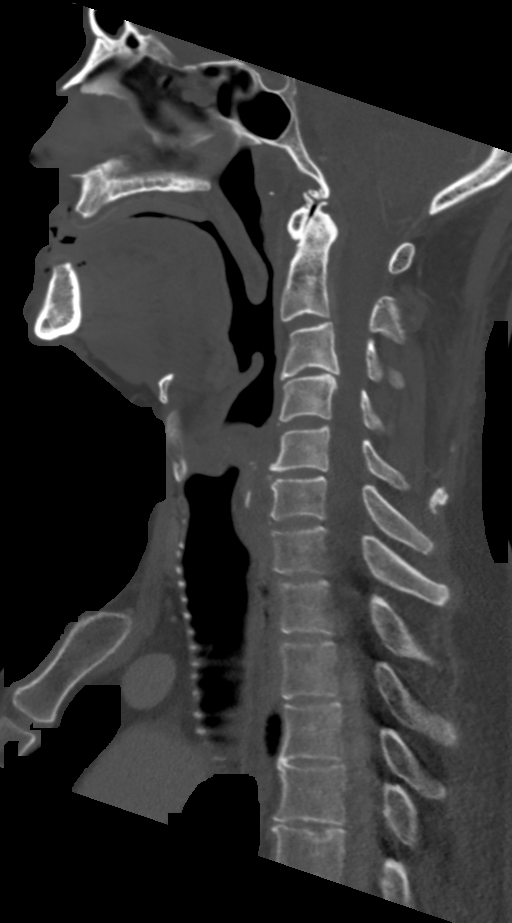
[im 47/81  bone]
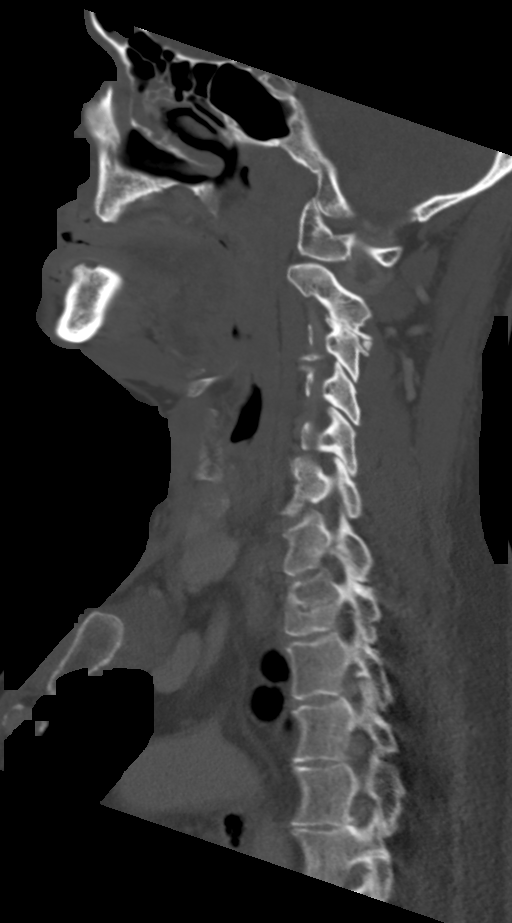
[im 54/81  bone]
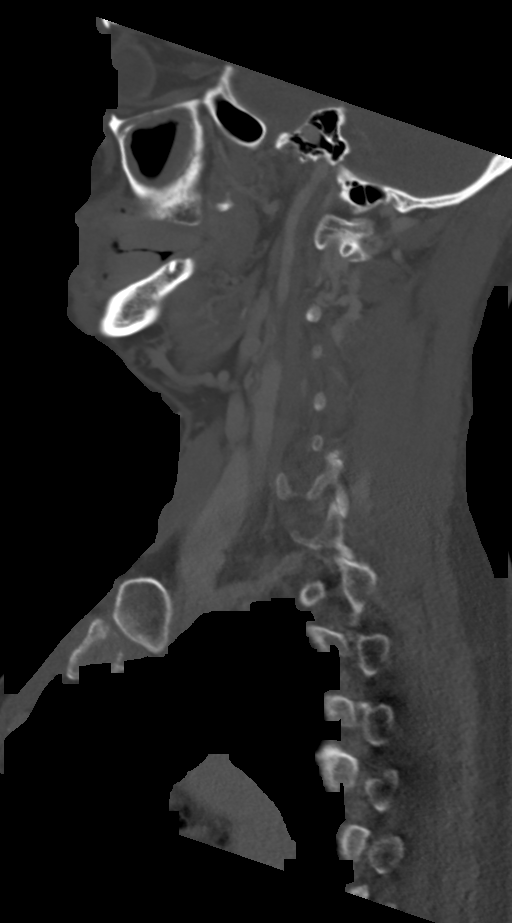

[Series 6: cor neck · coronal · 0.32mm/px · 3 of 97 slices shown]
[im 26/97  bone]
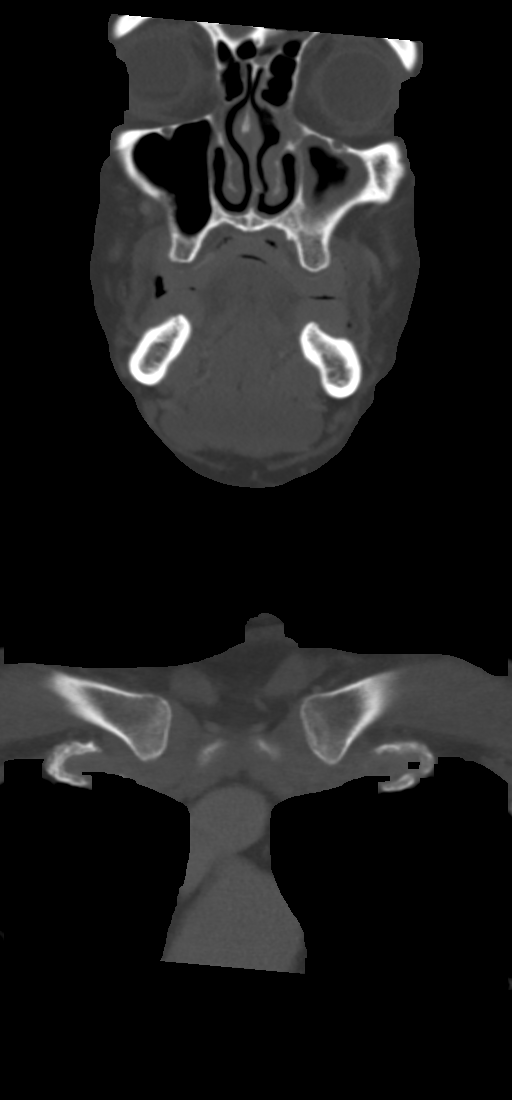
[im 41/97  bone]
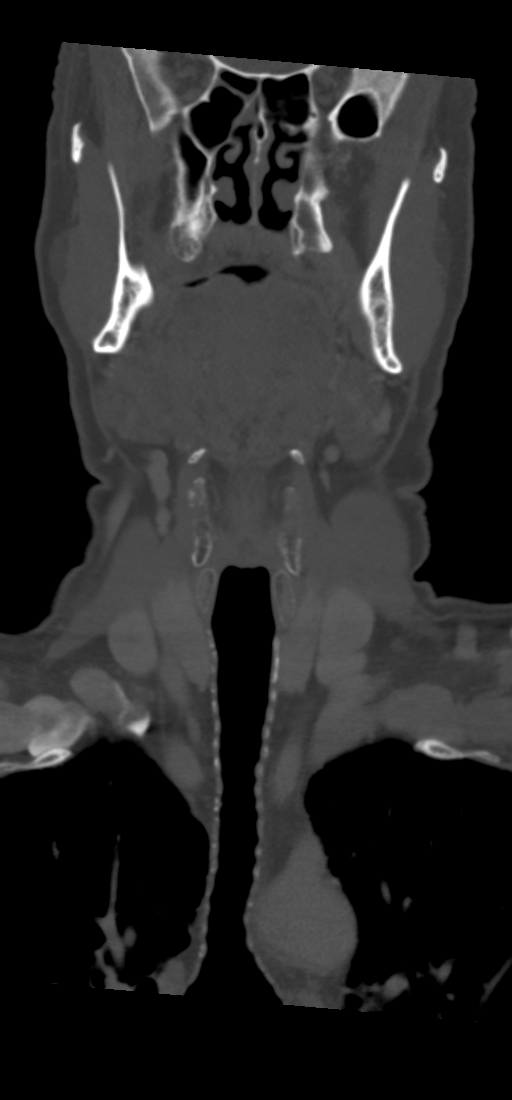
[im 56/97  bone]
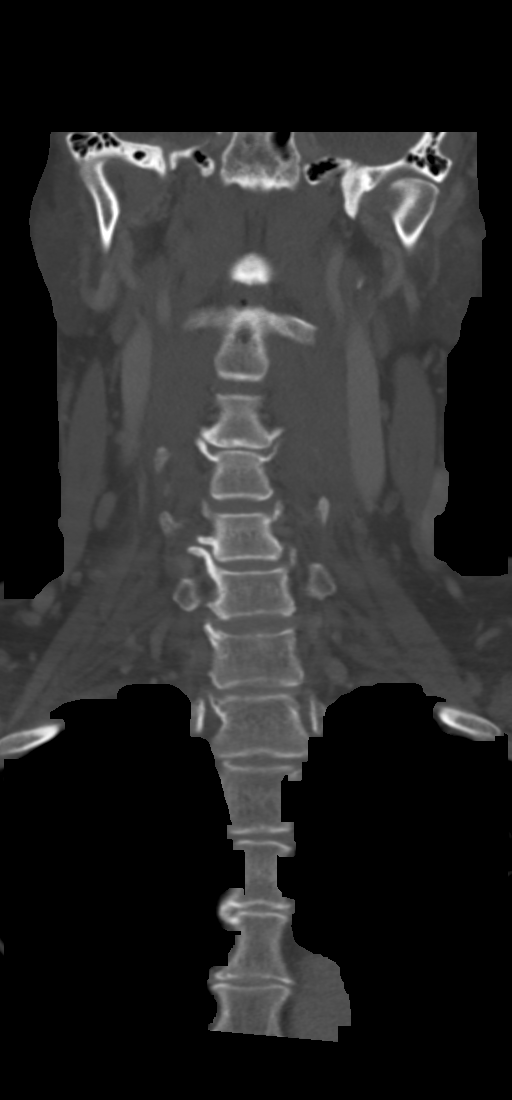

[Series 7: orthogonal ax · axial · 0.32mm/px · z∈[+297,+297]mm · 1 of 173 slices shown, 2 images]
[im 99/173  soft-tissue]
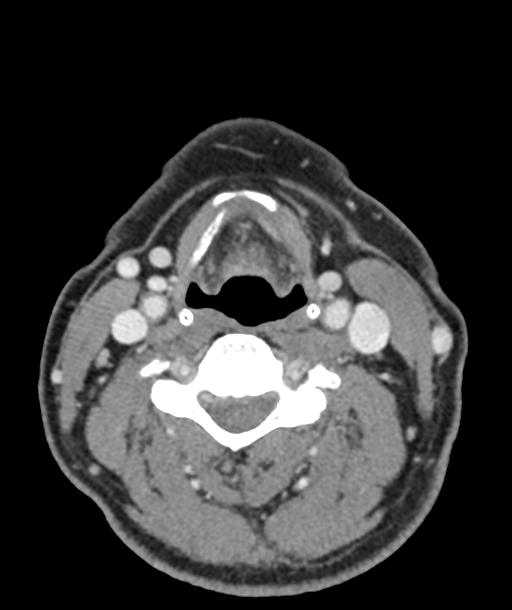
[im 99/173  bone]
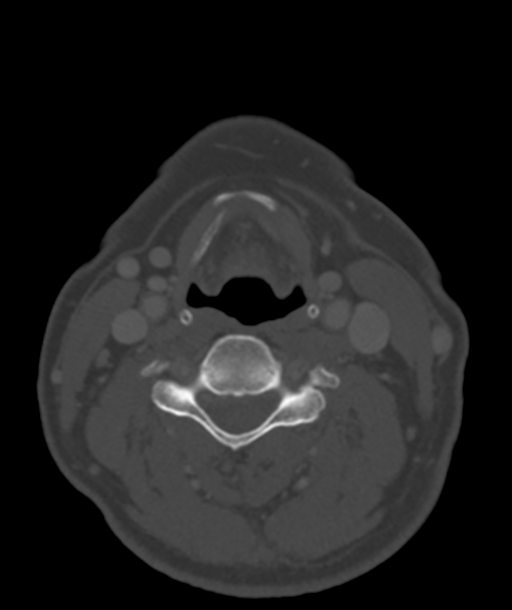

[9 of 35 positions shown; findings below may reference images not displayed]

FINDINGS: Pharynx and larynx: Oral cavity within normal limits without
discrete mass or loculated collection. Patient is edentulous.
Palatine tonsils symmetric and within normal limits. No discrete
tonsillar or peritonsillar abscess. Small calcified tonsillith noted
on the left. Remainder of the oropharynx within normal limits.
Nasopharynx demonstrates no acute finding. 12 mm retention cyst
noted at the central aspect of the adenoidal soft tissues with few
scattered peripheral calcifications. No retropharyngeal collection.
Epiglottis within normal limits without significant swelling.
Vallecula grossly clear. Remainder of the hypopharynx and
supraglottic larynx within normal limits. Glottis closed and not
well assessed. Small amount of layering secretions noted within the
subglottic trachea at the level of the carina.

Salivary glands: Salivary glands including the parotid and
submandibular glands are within normal limits.

Thyroid: Thyroid normal.

Lymph nodes: No pathologically enlarged lymph nodes seen within the
neck.

Vascular: Normal intravascular enhancement seen throughout the neck.
Mild atheromatous change about the right bifurcation without
significant stenosis.

Limited intracranial: Unremarkable.

Visualized orbits: Visualized globes and orbital soft tissues within
normal limits.

Mastoids and visualized paranasal sinuses: Acute on chronic left
maxillary sinusitis. Scattered mucosal thickening noted within the
ethmoidal air cells and partially visualized left frontal sinus.
Paranasal sinuses are otherwise clear. Visualize mastoids and middle
ear cavities are well pneumatized and free of fluid.

Skeleton: No acute osseous finding. No discrete lytic or blastic
osseous lesions. Mild multilevel cervical spondylosis, most notable
at C3-4 and C5-6.

Upper chest: Visualized upper chest demonstrates no acute finding.
Upper lobe predominant paraseptal emphysematous changes.
Superimposed focal ground-glass density at the right lung apex
measures 1.8 x 1.6 cm (series 2, image 108), indeterminate. Adjacent
5 mm nodule noted at the right lung apex as well (series 2, image
104).

Other: None.
IMPRESSION: 1. No CT evidence for acute abnormality within the neck. No findings
to explain patient's symptoms identified.
2. Acute on chronic left maxillary sinusitis.
3. Upper lobe predominant paraseptal emphysematous changes with
superimposed 1.8 x 1.6 cm ground-glass density at the right lung
apex, indeterminate. Initial follow-up with CT at 6-12 months is
recommended to confirm persistence. If persistent, repeat CT is
recommended every 2 years until 5 years of stability has been
established. This recommendation follows the consensus statement:
Guidelines for Management of Incidental Pulmonary Nodules Detected
[DATE].
4. Additional 5 mm solid nodule at the right lung apex. Attention at
follow-up recommended as well.
5. Emphysema ([id number]).

## 2019-06-15 MED ORDER — IOHEXOL 300 MG/ML  SOLN
75.0000 mL | Freq: Once | INTRAMUSCULAR | Status: AC | PRN
  Administered 2019-06-15: 75 mL via INTRAVENOUS
  Filled 2019-06-15: qty 75

## 2019-06-15 MED ORDER — KETOROLAC TROMETHAMINE 10 MG PO TABS: 10.0000 mg | ORAL_TABLET | Freq: Four times a day (QID) | ORAL | 0 refills | Status: AC | PRN

## 2019-06-15 MED ORDER — DEXAMETHASONE 10 MG/ML FOR PEDIATRIC ORAL USE
16.0000 mg | Freq: Once | INTRAMUSCULAR | Status: AC
  Administered 2019-06-15: 16 mg via ORAL
  Filled 2019-06-15: qty 2

## 2019-06-15 MED ORDER — KETOROLAC TROMETHAMINE 30 MG/ML IJ SOLN
30.0000 mg | Freq: Once | INTRAMUSCULAR | Status: AC
  Administered 2019-06-15: 30 mg via INTRAMUSCULAR
  Filled 2019-06-15: qty 1

## 2019-06-15 NOTE — ED Provider Notes (Signed)
[facility] Emergency Department Provider Note  ____________________________________________  Time seen: Approximately 11:23 PM  I have reviewed the triage vital signs and the nursing notes.   HISTORY  Chief Complaint Sore Throat    HPI Terry Pruitt is a 58 y.o. male presents to the emergency department with pharyngitis that started today.  Patient reports that he has been having difficulty managing his secretions at home.  Patient states that he has an extensive history of strep throat.  He is also had headache and body aches.  Patient is a daily smoker and is concern for esophageal cancer.  He denies chest pain, chest tightness, shortness of breath or fever.  He is able to speak in complete sentences.  No other alleviating measures have been attempted at home.        Past Medical History:  Diagnosis Date  . Diabetes mellitus without complication (HCC)     There are no active problems to display for this patient.   No past surgical history on file.  Prior to Admission medications   Medication Sig Start Date End Date Taking? Authorizing Provider  albuterol (PROVENTIL HFA;VENTOLIN HFA) 108 (90 Base) MCG/ACT inhaler Inhale 2 puffs into the lungs every 6 (six) hours as needed for wheezing or shortness of breath. 08/28/15   Triplett, Rulon Eisenmengerari B, FNP  albuterol (PROVENTIL HFA;VENTOLIN HFA) 108 (90 Base) MCG/ACT inhaler Inhale 2 puffs into the lungs every 6 (six) hours as needed for wheezing or shortness of breath. 03/26/17   Emily FilbertWilliams, Jonathan E, MD  azithromycin (ZITHROMAX) 250 MG tablet 2 tablets today, then 1 tablet for the next 4 days. 08/28/15   Triplett, Cari B, FNP  guaiFENesin-codeine 100-10 MG/5ML syrup Take 10 mLs by mouth 3 (three) times daily as needed. 08/28/15   Triplett, Rulon Eisenmengerari B, FNP  ketorolac (TORADOL) 10 MG tablet Take 1 tablet (10 mg total) by mouth every 6 (six) hours as needed for up to 5 days. 06/15/19 06/20/19  Orvil FeilWoods, Jauan Wohl M, PA-C   ondansetron (ZOFRAN ODT) 8 MG disintegrating tablet Take 1 tablet (8 mg total) by mouth every 8 (eight) hours as needed for nausea or vomiting. 04/03/17   Sharman CheekStafford, Phillip, MD  predniSONE (DELTASONE) 10 MG tablet Take 5 tablets (50 mg total) by mouth daily. 08/28/15   Triplett, Cari B, FNP  predniSONE (STERAPRED UNI-PAK 21 TAB) 10 MG (21) TBPK tablet Take by mouth daily. Dispense steroid taper pack as directed 03/26/17   Emily FilbertWilliams, Jonathan E, MD  tamsulosin (FLOMAX) 0.4 MG CAPS capsule Take 1 capsule (0.4 mg total) by mouth daily. 04/03/17   Sharman CheekStafford, Phillip, MD    Allergies Patient has no known allergies.  No family history on file.  Social History Social History   Tobacco Use  . Smoking status: Current Every Day Smoker    Packs/day: 1.00    Types: Cigarettes  . Smokeless tobacco: Never Used  Substance Use Topics  . Alcohol use: No  . Drug use: No     Review of Systems  Constitutional: No fever/chills Eyes: No visual changes. No discharge ENT: Patient has pharyngitis. Cardiovascular: no chest pain. Respiratory: no cough. No SOB. Gastrointestinal: No abdominal pain.  No nausea, no vomiting.  No diarrhea.  No constipation. Musculoskeletal: Negative for musculoskeletal pain. Skin: Negative for rash, abrasions, lacerations, ecchymosis. Neurological: Negative for headaches, focal weakness or numbness.   ____________________________________________   PHYSICAL EXAM:  VITAL SIGNS: ED Triage Vitals [06/15/19 1836]  Enc Vitals Group     BP 133/82  Pulse Rate (!) 104     Resp 18     Temp 97.9 F (36.6 C)     Temp Source Oral     SpO2 97 %     Weight 184 lb (83.5 kg)     Height 6' (1.829 m)     Head Circumference      Peak Flow      Pain Score 6     Pain Loc      Pain Edu?      Excl. in GC?      Constitutional: Alert and oriented. Well appearing and in no acute distress. Eyes: Conjunctivae are normal. PERRL. EOMI. Head: Atraumatic. ENT:      Ears: TMs are  pearly.      Nose: No congestion/rhinnorhea.      Mouth/Throat: Mucous membranes are moist.  Posterior pharynx is mildly erythematous.  Patient has very little tonsillar matter visualized.  He has left submandibular swelling and tenderness to palpation. Neck: No stridor.  No cervical spine tenderness to palpation. Hematological/Lymphatic/Immunilogical: Palpable cervical lymphadenopathy. Cardiovascular: Normal rate, regular rhythm. Normal S1 and S2.  Good peripheral circulation. Respiratory: Normal respiratory effort without tachypnea or retractions. Lungs CTAB. Good air entry to the bases with no decreased or absent breath sounds. Gastrointestinal: Bowel sounds 4 quadrants. Soft and nontender to palpation. No guarding or rigidity. No palpable masses. No distention. No CVA tenderness. Musculoskeletal: Full range of motion to all extremities. No gross deformities appreciated. Neurologic:  Normal speech and language. No gross focal neurologic deficits are appreciated.  Skin:  Skin is warm, dry and intact. No rash noted. Psychiatric: Mood and affect are normal. Speech and behavior are normal. Patient exhibits appropriate insight and judgement.   ____________________________________________   LABS (all labs ordered are listed, but only abnormal results are displayed)  Labs Reviewed  CBC WITH DIFFERENTIAL/PLATELET - Abnormal; Notable for the following components:      Result Value   WBC 10.9 (*)    RBC 3.93 (*)    Hemoglobin 12.3 (*)    HCT 37.7 (*)    All other components within normal limits  COMPREHENSIVE METABOLIC PANEL - Abnormal; Notable for the following components:   Glucose, Bld 186 (*)    BUN 21 (*)    All other components within normal limits  GROUP A STREP BY PCR   ____________________________________________  EKG   ____________________________________________  RADIOLOGY I personally viewed and evaluated these images as part of my medical decision making, as well as  reviewing the written report by the radiologist.  Ct Soft Tissue Neck W Contrast  Result Date: 06/15/2019 CLINICAL DATA:  Initial evaluation for acute sore throat, stridor. EXAM: CT NECK WITH CONTRAST TECHNIQUE: Multidetector CT imaging of the neck was performed using the standard protocol following the bolus administration of intravenous contrast. CONTRAST:  11mL OMNIPAQUE IOHEXOL 300 MG/ML  SOLN COMPARISON:  None. FINDINGS: Pharynx and larynx: Oral cavity within normal limits without discrete mass or loculated collection. Patient is edentulous. Palatine tonsils symmetric and within normal limits. No discrete tonsillar or peritonsillar abscess. Small calcified tonsillith noted on the left. Remainder of the oropharynx within normal limits. Nasopharynx demonstrates no acute finding. 12 mm retention cyst noted at the central aspect of the adenoidal soft tissues with few scattered peripheral calcifications. No retropharyngeal collection. Epiglottis within normal limits without significant swelling. Vallecula grossly clear. Remainder of the hypopharynx and supraglottic larynx within normal limits. Glottis closed and not well assessed. Small amount of layering secretions noted  within the subglottic trachea at the level of the carina. Salivary glands: Salivary glands including the parotid and submandibular glands are within normal limits. Thyroid: Thyroid normal. Lymph nodes: No pathologically enlarged lymph nodes seen within the neck. Vascular: Normal intravascular enhancement seen throughout the neck. Mild atheromatous change about the right bifurcation without significant stenosis. Limited intracranial: Unremarkable. Visualized orbits: Visualized globes and orbital soft tissues within normal limits. Mastoids and visualized paranasal sinuses: Acute on chronic left maxillary sinusitis. Scattered mucosal thickening noted within the ethmoidal air cells and partially visualized left frontal sinus. Paranasal sinuses are  otherwise clear. Visualize mastoids and middle ear cavities are well pneumatized and free of fluid. Skeleton: No acute osseous finding. No discrete lytic or blastic osseous lesions. Mild multilevel cervical spondylosis, most notable at C3-4 and C5-6. Upper chest: Visualized upper chest demonstrates no acute finding. Upper lobe predominant paraseptal emphysematous changes. Superimposed focal ground-glass density at the right lung apex measures 1.8 x 1.6 cm (series 2, image 108), indeterminate. Adjacent 5 mm nodule noted at the right lung apex as well (series 2, image 104). Other: None. IMPRESSION: 1. No CT evidence for acute abnormality within the neck. No findings to explain patient's symptoms identified. 2. Acute on chronic left maxillary sinusitis. 3. Upper lobe predominant paraseptal emphysematous changes with superimposed 1.8 x 1.6 cm ground-glass density at the right lung apex, indeterminate. Initial follow-up with CT at 6-12 months is recommended to confirm persistence. If persistent, repeat CT is recommended every 2 years until 5 years of stability has been established. This recommendation follows the consensus statement: Guidelines for Management of Incidental Pulmonary Nodules Detected on CT Images: From the Fleischner Society 2017; Radiology 2017; 284:228-243. 4. Additional 5 mm solid nodule at the right lung apex. Attention at follow-up recommended as well. 5. Emphysema (ICD10-J43.9). Electronically Signed   By: Jeannine Boga M.D.   On: 06/15/2019 23:13    ____________________________________________    PROCEDURES  Procedure(s) performed:    Procedures    Medications  ketorolac (TORADOL) 30 MG/ML injection 30 mg (30 mg Intramuscular Given 06/15/19 2106)  dexamethasone (DECADRON) 10 MG/ML injection for Pediatric ORAL use 16 mg (16 mg Oral Given 06/15/19 2104)  iohexol (OMNIPAQUE) 300 MG/ML solution 75 mL (75 mLs Intravenous Contrast Given 06/15/19 2250)      ____________________________________________   INITIAL IMPRESSION / ASSESSMENT AND PLAN / ED COURSE  Pertinent labs & imaging results that were available during my care of the patient were reviewed by me and considered in my medical decision making (see chart for details).  Review of the [place] CSRS was performed in accordance of the [place] prior to dispensing any controlled drugs.         Assessment and plan Pharyngitis 58 year old male presents to the emergency department with pharyngitis that started this morning and difficulty maintaining his secretions.  Patient was mildly tachycardic at triage but vital signs were otherwise reassuring.  Patient was not using an emesis bag in exam room.  He was able to speak in complete sentences.  Posterior pharynx was mildly erythematous with no significant tonsillar hypertrophy or exudate.  Did appreciate some left-sided submandibular fullness and tenderness to palpation.  Differential diagnosis included Ludwig's, peritonsillar abscess, sialadenitis, group A strep pharyngitis, viral pharyngitis...  Patient tested negative for group A strep.  CT soft tissue neck revealed no evidence of Ludwig's or other deep space infection.  Patient was given Toradol and Decadron in the emergency department he reported that his pain improved significantly.  Patient was  advised to use oral Toradol at home.  Return precautions were given.  All patient questions were answered.    ____________________________________________  FINAL CLINICAL IMPRESSION(S) / ED DIAGNOSES  Final diagnoses:  Pharyngitis, unspecified etiology      NEW MEDICATIONS STARTED DURING THIS VISIT:  ED Discharge Orders         Ordered    ketorolac (TORADOL) 10 MG tablet  Every 6 hours PRN     06/15/19 2321              This chart was dictated using voice recognition software/Dragon. Despite best efforts to proofread, errors can occur which can change the meaning. Any  change was purely unintentional.    Orvil Feil, PA-C 06/15/19 2328    Willy Eddy, MD 06/15/19 [id number]

## 2019-06-15 NOTE — ED Triage Notes (Signed)
Pt states that he has been having sore throat starting this am, pt states every time he feels this way its strep throat. Pt states it is causing his ears to hurt also, pt reports that he was tested for covid on thurs with negative results received yesterday, he was tested due to knowing an upstairs neighbor was positive on the 7th floor he lives in the [address] floor, denies being around that person, but wanted to be safe than sorry

## 2020-02-12 ENCOUNTER — Ambulatory Visit: Payer: Self-pay | Admitting: Adult Health

## 2020-03-25 ENCOUNTER — Other Ambulatory Visit: Payer: Self-pay

## 2020-03-25 ENCOUNTER — Encounter: Payer: Self-pay | Admitting: Emergency Medicine

## 2020-03-25 ENCOUNTER — Emergency Department: Payer: Self-pay

## 2020-03-25 ENCOUNTER — Emergency Department
Admission: EM | Admit: 2020-03-25 | Discharge: 2020-03-25 | Disposition: A | Discharge status: Home / Self Care | Payer: Self-pay | Attending: Emergency Medicine | Admitting: Emergency Medicine

## 2020-03-25 DIAGNOSIS — Z20822 Contact with and (suspected) exposure to covid-19: Secondary | ICD-10-CM | POA: Insufficient documentation

## 2020-03-25 DIAGNOSIS — Z720 Tobacco use: Secondary | ICD-10-CM

## 2020-03-25 DIAGNOSIS — I10 Essential (primary) hypertension: Secondary | ICD-10-CM | POA: Insufficient documentation

## 2020-03-25 DIAGNOSIS — J449 Chronic obstructive pulmonary disease, unspecified: Secondary | ICD-10-CM | POA: Insufficient documentation

## 2020-03-25 DIAGNOSIS — R42 Dizziness and giddiness: Secondary | ICD-10-CM | POA: Insufficient documentation

## 2020-03-25 DIAGNOSIS — E119 Type 2 diabetes mellitus without complications: Secondary | ICD-10-CM | POA: Insufficient documentation

## 2020-03-25 DIAGNOSIS — Z7951 Long term (current) use of inhaled steroids: Secondary | ICD-10-CM | POA: Insufficient documentation

## 2020-03-25 DIAGNOSIS — F17210 Nicotine dependence, cigarettes, uncomplicated: Secondary | ICD-10-CM | POA: Insufficient documentation

## 2020-03-25 DIAGNOSIS — J40 Bronchitis, not specified as acute or chronic: Secondary | ICD-10-CM | POA: Insufficient documentation

## 2020-03-25 LAB — COMPREHENSIVE METABOLIC PANEL
ALT: 18 U/L (ref 0–44)
AST: 16 U/L (ref 15–41)
Albumin: 3.8 g/dL (ref 3.5–5.0)
Alkaline Phosphatase: 94 U/L (ref 38–126)
Anion gap: 11 (ref 5–15)
BUN: 19 mg/dL (ref 6–20)
CO2: 24 mmol/L (ref 22–32)
Calcium: 8.9 mg/dL (ref 8.9–10.3)
Chloride: 104 mmol/L (ref 98–111)
Creatinine, Ser: 0.95 mg/dL (ref 0.61–1.24)
GFR calc Af Amer: >60 mL/min (ref >60)
GFR calc non Af Amer: >60 mL/min (ref >60)
Glucose, Bld: 135 mg/dL — ABNORMAL HIGH (ref 70–99)
Potassium: 4.1 mmol/L (ref 3.5–5.1)
Sodium: 139 mmol/L (ref 135–145)
Total Bilirubin: 0.5 mg/dL (ref 0.3–1.2)
Total Protein: 7.4 g/dL (ref 6.5–8.1)

## 2020-03-25 LAB — CBC WITH DIFFERENTIAL/PLATELET
Abs Immature Granulocytes: 0.03 10*3/uL (ref 0.00–0.07)
Basophils Absolute: 0 10*3/uL (ref 0.0–0.1)
Basophils Relative: 1 %
Eosinophils Absolute: 0.3 10*3/uL (ref 0.0–0.5)
Eosinophils Relative: 4 %
HCT: 39 % (ref 39.0–52.0)
Hemoglobin: 12.9 g/dL — ABNORMAL LOW (ref 13.0–17.0)
Immature Granulocytes: 0 %
Lymphocytes Relative: 24 %
Lymphs Abs: 2 10*3/uL (ref 0.7–4.0)
MCH: 31.3 pg (ref 26.0–34.0)
MCHC: 33.1 g/dL (ref 30.0–36.0)
MCV: 94.7 fL (ref 80.0–100.0)
Monocytes Absolute: 0.7 10*3/uL (ref 0.1–1.0)
Monocytes Relative: 8 %
Neutro Abs: 5.1 10*3/uL (ref 1.7–7.7)
Neutrophils Relative %: 63 %
Platelets: 255 10*3/uL (ref 150–400)
RBC: 4.12 MIL/uL — ABNORMAL LOW (ref 4.22–5.81)
RDW: 13 % (ref 11.5–15.5)
WBC: 8.1 10*3/uL (ref 4.0–10.5)
nRBC: 0 % (ref 0.0–0.2)

## 2020-03-25 LAB — SARS CORONAVIRUS 2 BY RT PCR (HOSPITAL ORDER, PERFORMED IN ~~LOC~~ HOSPITAL LAB): SARS Coronavirus 2: NEGATIVE

## 2020-03-25 IMAGING — CR DG CHEST 2V
1 series · 2 of 2 positions shown · non-contrast
Comparison: 03/26/2017.

CLINICAL DATA: Cough.

EXAM:
CHEST - 2 VIEW

[Series 1: dg chest 2 view · 0.14mm/px · 2 of 2 slices shown]
[im 1/2]
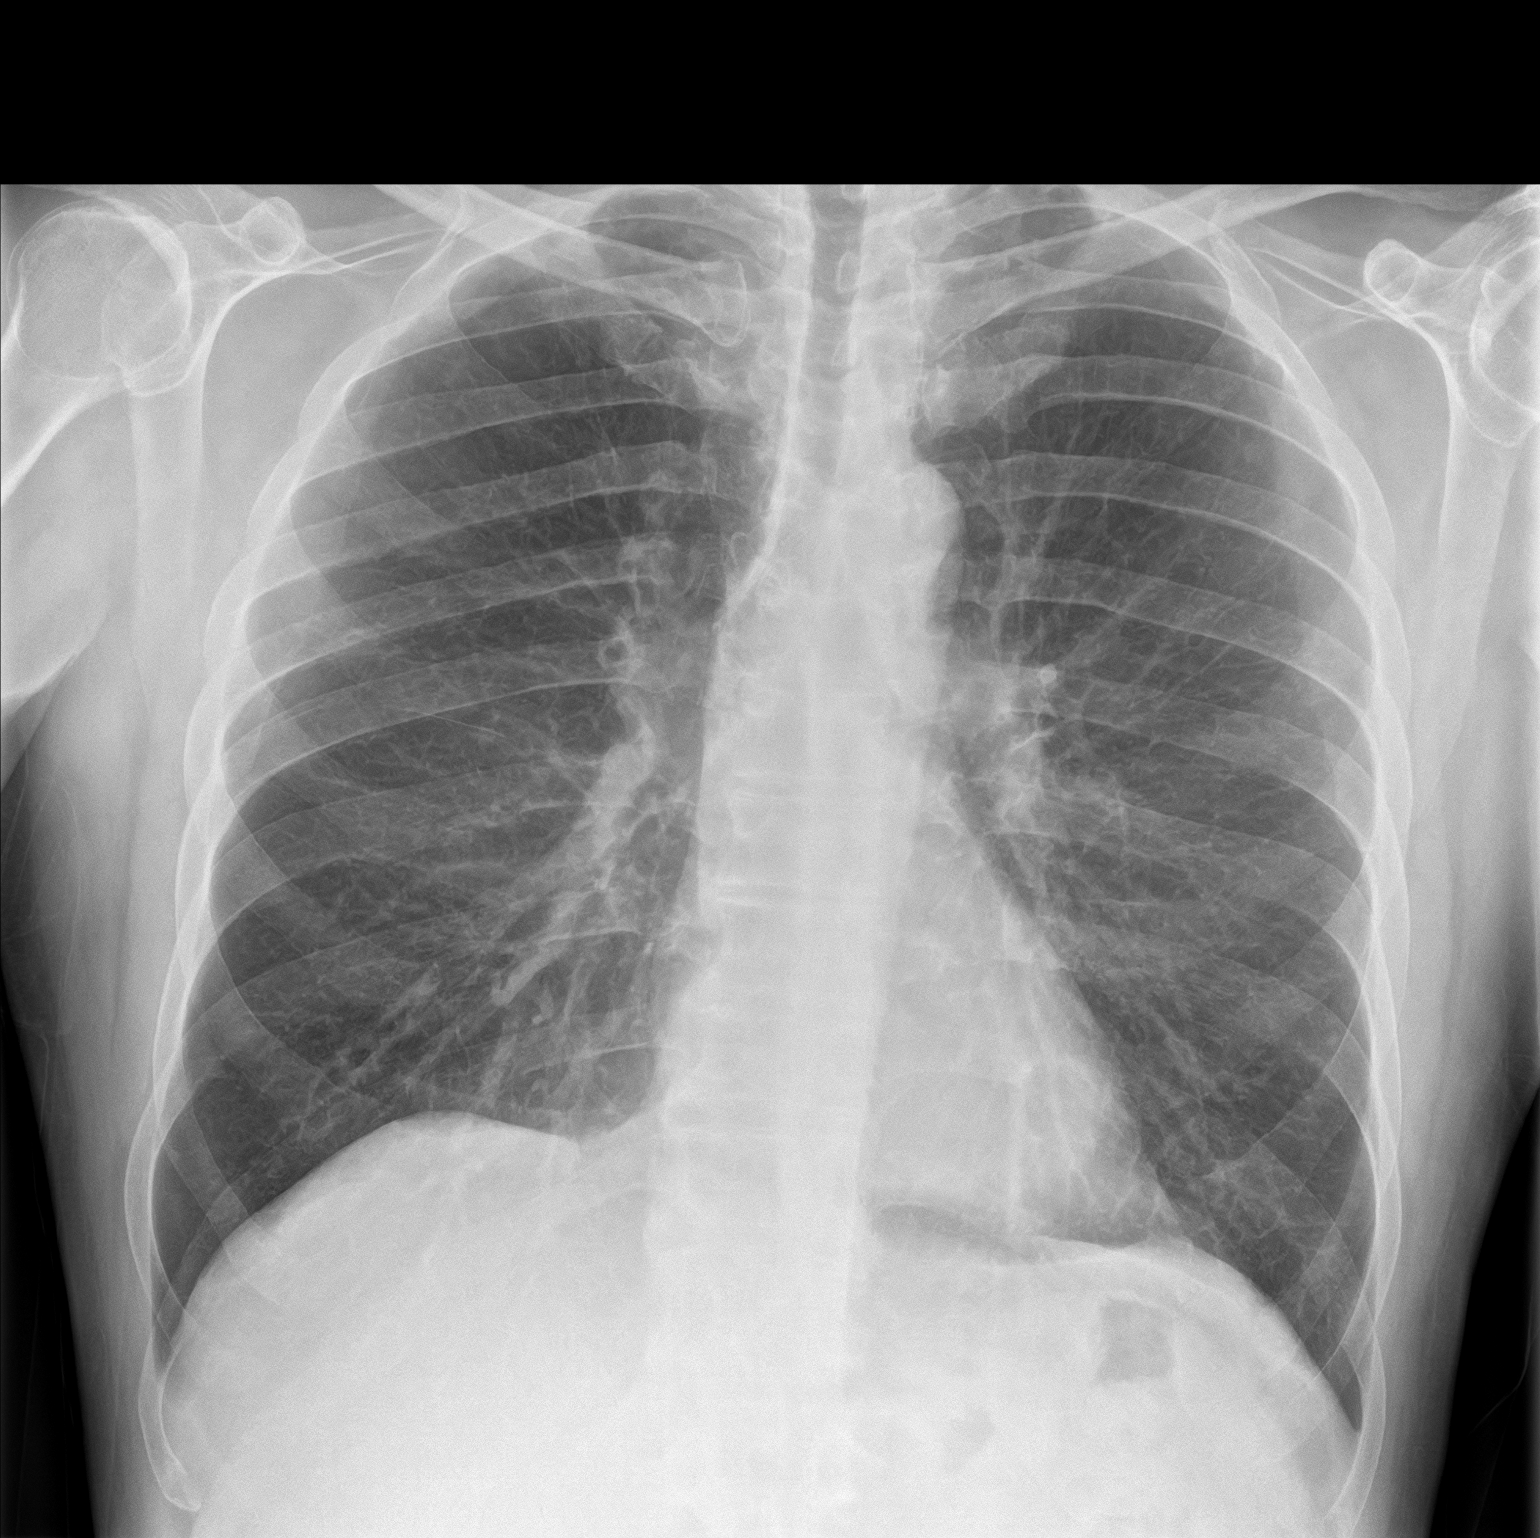
[im 2/2]
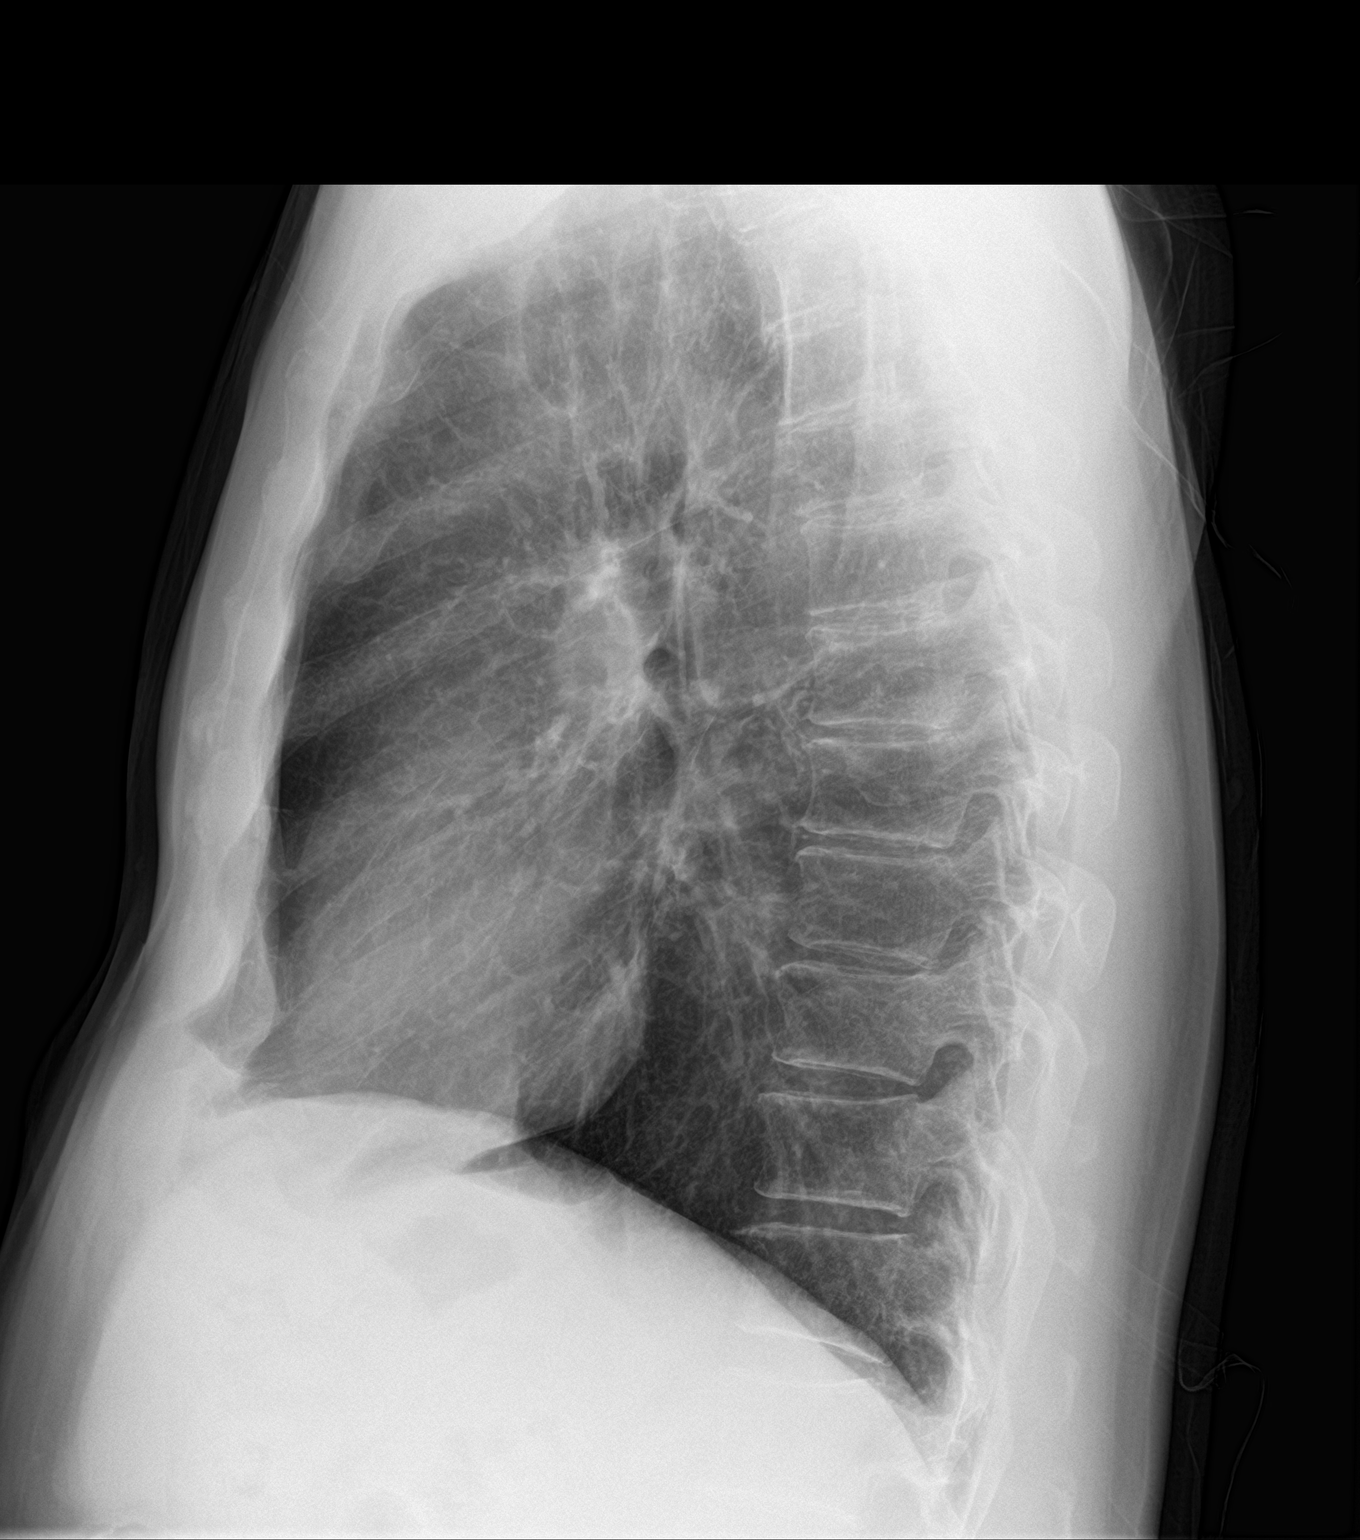

[2 of 2 positions shown; findings below may reference images not displayed]

FINDINGS: Mediastinum hilar structures normal. Lungs are clear. No focal
infiltrate. Stable bilateral pleural-parenchymal thickening
consistent scarring. No pleural effusion or pneumothorax.
Degenerative change thoracic spine.
IMPRESSION: No acute cardiopulmonary disease.

## 2020-03-25 NOTE — ED Triage Notes (Signed)
Pt presents to ED via POV, reports cough with green sputum, reports hx of walking penumonia. Pt noted be able to speak in full and complete sentences however with noted icnreasing difficulty. VSS in triage, A&O x4.

## 2020-03-25 NOTE — ED Provider Notes (Signed)
[facility] Emergency Department Provider Note  ____________________________________________   First MD Initiated Contact with Patient 03/25/20 1206     (approximate)  I have reviewed the triage vital signs and the nursing notes.   HISTORY  Chief Complaint Cough and Shortness of Breath   HPI Terry Pruitt is a 59 y.o. male with a past medical history of tobacco abuse, DM, HTN, HDL, and COPD who presents for assessment approximately 4 to 5 days of productive cough with green sputum, fatigue, malaise, poor p.o. intake, myalgias, and some mild lightheadedness.  Patient also states he does not have a PCP and has not been taking of his medications for some time.  He endorses some tightness in his chest when he is coughing but no other chest pain, ear pain, eye pain, mouth pain, abdominal pain, vomiting, diarrhea, dysuria, rash, recent traumatic injuries, or other acute complaints.  Denies illegal drug use or EtOH abuse.  No other clear alleviating or aggravating factors.  No prior similar episodes.  No sick contacts.         Past Medical History:  Diagnosis Date  . Diabetes mellitus without complication (HCC)     There are no problems to display for this patient.   History reviewed. No pertinent surgical history.  Prior to Admission medications   Medication Sig Start Date End Date Taking? Authorizing Provider  albuterol (PROVENTIL HFA;VENTOLIN HFA) 108 (90 Base) MCG/ACT inhaler Inhale 2 puffs into the lungs every 6 (six) hours as needed for wheezing or shortness of breath. 08/28/15   Triplett, Rulon Eisenmenger B, FNP  albuterol (PROVENTIL HFA;VENTOLIN HFA) 108 (90 Base) MCG/ACT inhaler Inhale 2 puffs into the lungs every 6 (six) hours as needed for wheezing or shortness of breath. 03/26/17   Emily Filbert, MD  azithromycin (ZITHROMAX) 250 MG tablet 2 tablets today, then 1 tablet for the next 4 days. 08/28/15   Triplett, Cari B, FNP  guaiFENesin-codeine 100-10 MG/5ML  syrup Take 10 mLs by mouth 3 (three) times daily as needed. 08/28/15   Triplett, Cari B, FNP  ondansetron (ZOFRAN ODT) 8 MG disintegrating tablet Take 1 tablet (8 mg total) by mouth every 8 (eight) hours as needed for nausea or vomiting. 04/03/17   Sharman Cheek, MD  predniSONE (DELTASONE) 10 MG tablet Take 5 tablets (50 mg total) by mouth daily. 08/28/15   Triplett, Cari B, FNP  predniSONE (STERAPRED UNI-PAK 21 TAB) 10 MG (21) TBPK tablet Take by mouth daily. Dispense steroid taper pack as directed 03/26/17   Emily Filbert, MD  tamsulosin (FLOMAX) 0.4 MG CAPS capsule Take 1 capsule (0.4 mg total) by mouth daily. 04/03/17   Sharman Cheek, MD    Allergies Patient has no known allergies.  No family history on file.  Social History Social History   Tobacco Use  . Smoking status: Current Every Day Smoker    Packs/day: 1.00    Types: Cigarettes  . Smokeless tobacco: Never Used  Substance Use Topics  . Alcohol use: No  . Drug use: No    Review of Systems  Review of Systems  Constitutional: Positive for malaise/fatigue. Negative for chills and fever.  HENT: Negative for sore throat.   Eyes: Negative for pain.  Respiratory: Positive for cough and sputum production. Negative for stridor.   Cardiovascular: Negative for orthopnea, claudication, leg swelling and PND.  Gastrointestinal: Negative for vomiting.  Musculoskeletal: Positive for myalgias.  Skin: Negative for rash.  Neurological: Positive for dizziness. Negative for seizures, loss  of consciousness and headaches.  Psychiatric/Behavioral: Negative for suicidal ideas.  All other systems reviewed and are negative.     ____________________________________________   PHYSICAL EXAM:  VITAL SIGNS: ED Triage Vitals [03/25/20 0827]  Enc Vitals Group     BP (!) 173/95     Pulse Rate 87     Resp (!) 24     Temp 98.7 F (37.1 C)     Temp Source Oral     SpO2 97 %     Weight 170 lb (77.1 kg)     Height 6' (1.829 m)       Head Circumference      Peak Flow      Pain Score 3     Pain Loc      Pain Edu?      Excl. in GC?    Vitals:   03/25/20 0827 03/25/20 1205  BP: (!) 173/95 (!) 149/97  Pulse: 87 71  Resp: (!) 24 18  Temp: 98.7 F (37.1 C) 97.7 F (36.5 C)  SpO2: 97% 98%   Physical Exam Vitals and nursing note reviewed.  Constitutional:      Appearance: He is well-developed.  HENT:     Head: Normocephalic and atraumatic.     Right Ear: External ear normal.     Left Ear: External ear normal.     Nose: Nose normal.     Mouth/Throat:     Mouth: Mucous membranes are moist.  Eyes:     Conjunctiva/sclera: Conjunctivae normal.  Cardiovascular:     Rate and Rhythm: Normal rate and regular rhythm.     Pulses: Normal pulses.     Heart sounds: No murmur heard.   Pulmonary:     Effort: Pulmonary effort is normal. No respiratory distress.     Breath sounds: Normal breath sounds. No wheezing, rhonchi or rales.  Abdominal:     Palpations: Abdomen is soft.     Tenderness: There is no abdominal tenderness.  Musculoskeletal:     Cervical back: Neck supple.  Skin:    General: Skin is warm and dry.     Capillary Refill: Capillary refill takes less than 2 seconds.  Neurological:     General: No focal deficit present.     Mental Status: He is alert and oriented to person, place, and time.  Psychiatric:        Mood and Affect: Mood normal.      ____________________________________________   LABS (all labs ordered are listed, but only abnormal results are displayed)  Labs Reviewed  CBC WITH DIFFERENTIAL/PLATELET - Abnormal; Notable for the following components:      Result Value   RBC 4.12 (*)    Hemoglobin 12.9 (*)    All other components within normal limits  COMPREHENSIVE METABOLIC PANEL - Abnormal; Notable for the following components:   Glucose, Bld 135 (*)    All other components within normal limits  SARS CORONAVIRUS 2 BY RT PCR (HOSPITAL ORDER, PERFORMED IN [facility]  LAB)   ____________________________________________  EKG  Sinus rhythm with PVCs, ventricular rate of 85, normal axis, unremarkable intervals, no evidence of acute ischemia or other underlying rhythm. ____________________________________________  RADIOLOGY  ED MD interpretation: Evidence of consolidative process, effusion, edema, or pneumothorax.  Official radiology report(s): DG Chest 2 View  Result Date: 03/25/2020 CLINICAL DATA:  Cough. EXAM: CHEST - 2 VIEW COMPARISON:  03/26/2017. FINDINGS: Mediastinum hilar structures normal. Lungs are clear. No focal infiltrate. Stable bilateral pleural-parenchymal thickening consistent  scarring. No pleural effusion or pneumothorax. Degenerative change thoracic spine. IMPRESSION: No acute cardiopulmonary disease. Electronically Signed   By: Maisie Fus  Register   On: 03/25/2020 08:58    ____________________________________________   PROCEDURES  Procedure(s) performed (including Critical Care):  Procedures   ____________________________________________   INITIAL IMPRESSION / ASSESSMENT AND PLAN / ED COURSE        Overall patient's history, exam, and work-up is most consistent with viral bronchitis.  COVID-19 is in the differential and Covid PCR was sent.  Patient is noted to be hypertensive in the ED but otherwise stable vital signs on room air.  No evidence of consolidative process on chest x-ray to suggest bacterial pneumonia.  Patient has no wheezing or hypoxia I have low suspicion for COPD exacerbation at this time.  Overall EKG and presentation are not consistent with ACS and I would low suspicion for dissection, PE, or other immediate life-threatening process.  No evidence of significant electrolyte or metabolic treatment on CMP and CBC is unremarkable.  Patient is not sure what medications he is supposed to be on for his blood pressure and diabetes and I discussed at length with him that his blood pressure was elevated today and it in  addition to having this rechecked by his PCP he must establish PCP care as soon as possible to get back on his regular chronic medications, have his blood pressure rechecked, and follow-up his symptoms that he presented for today.           ____________________________________________   FINAL CLINICAL IMPRESSION(S) / ED DIAGNOSES  Final diagnoses:  Bronchitis  Tobacco abuse  Person under investigation for COVID-19     ED Discharge Orders    None       Note:  This document was prepared using Dragon voice recognition software and may include unintentional dictation errors.   Gilles Chiquito, MD 03/25/20 1225

## 2020-08-26 ENCOUNTER — Other Ambulatory Visit: Payer: Self-pay | Admitting: Internal Medicine

## 2020-08-26 ENCOUNTER — Ambulatory Visit
Admission: RE | Admit: 2020-08-26 | Discharge: 2020-08-26 | Disposition: A | Discharge status: Home / Self Care | Payer: Disability Insurance | Source: Ambulatory Visit | Attending: Internal Medicine | Admitting: Internal Medicine

## 2020-08-26 DIAGNOSIS — M542 Cervicalgia: Secondary | ICD-10-CM

## 2020-08-26 IMAGING — CR DG CERVICAL SPINE 2 OR 3 VIEWS
1 series · 3 of 3 positions shown · non-contrast
Comparison: None.

CLINICAL DATA: Chronic cervical neck pain.

EXAM:
CERVICAL SPINE - 2-3 VIEW

[Series 1: dg cervical spine 2 or 3 views · 0.14mm/px · 3 of 3 slices shown]
[im 1/3]
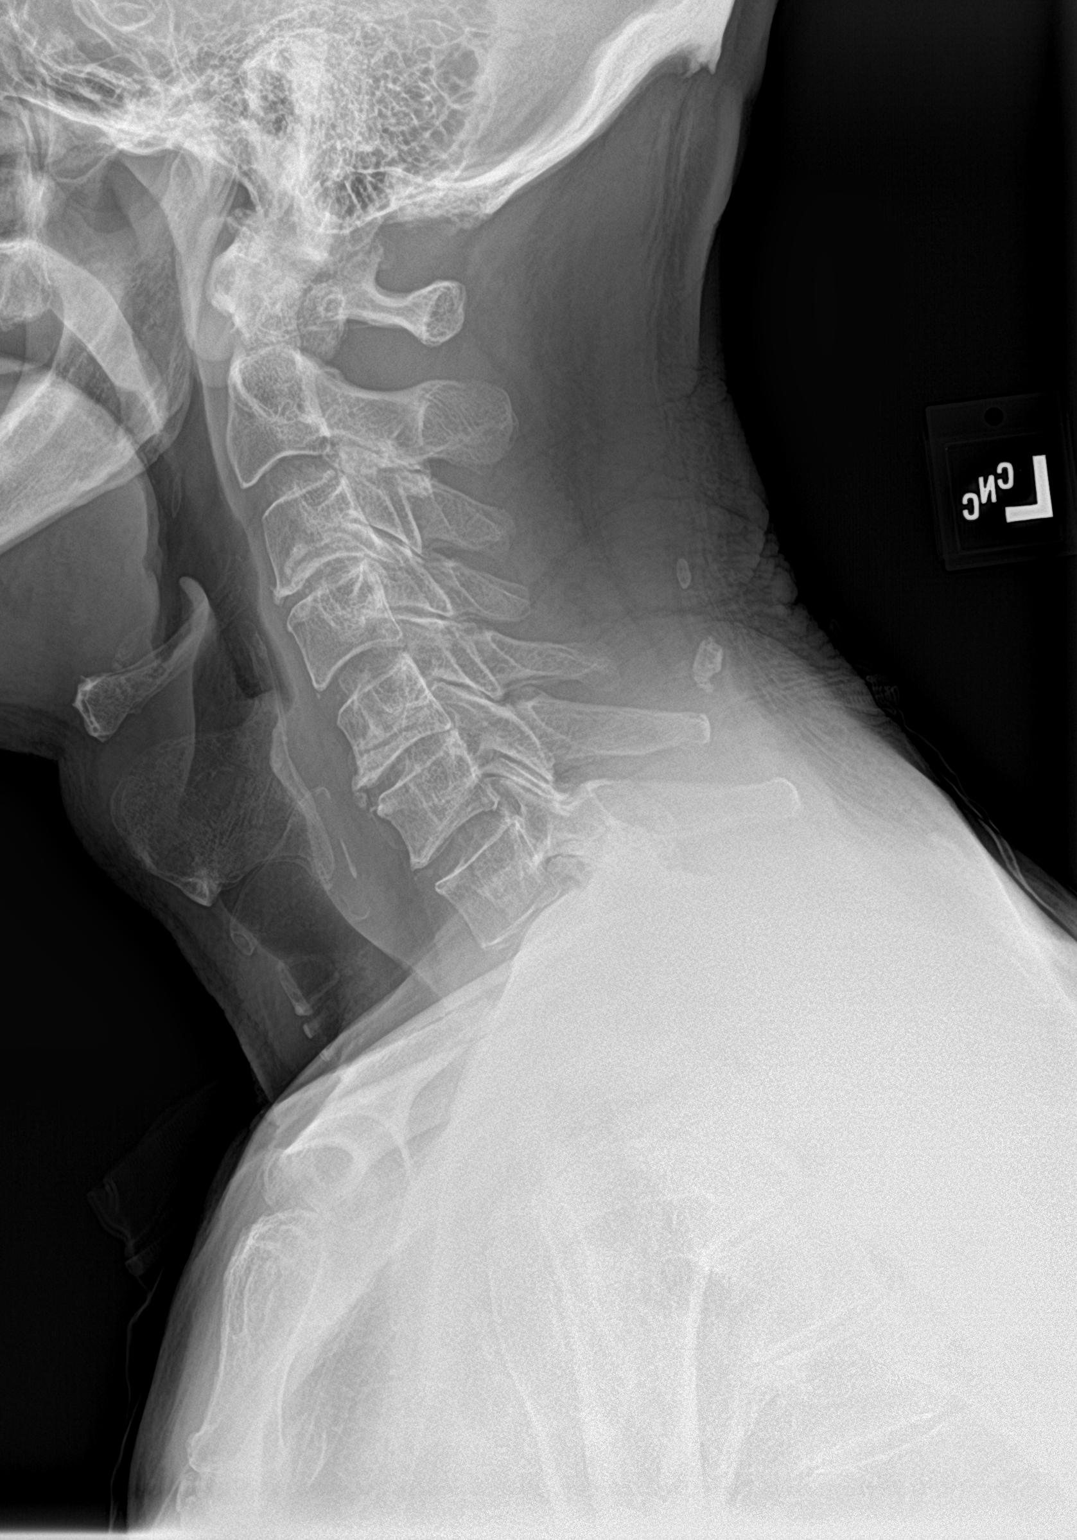
[im 2/3]
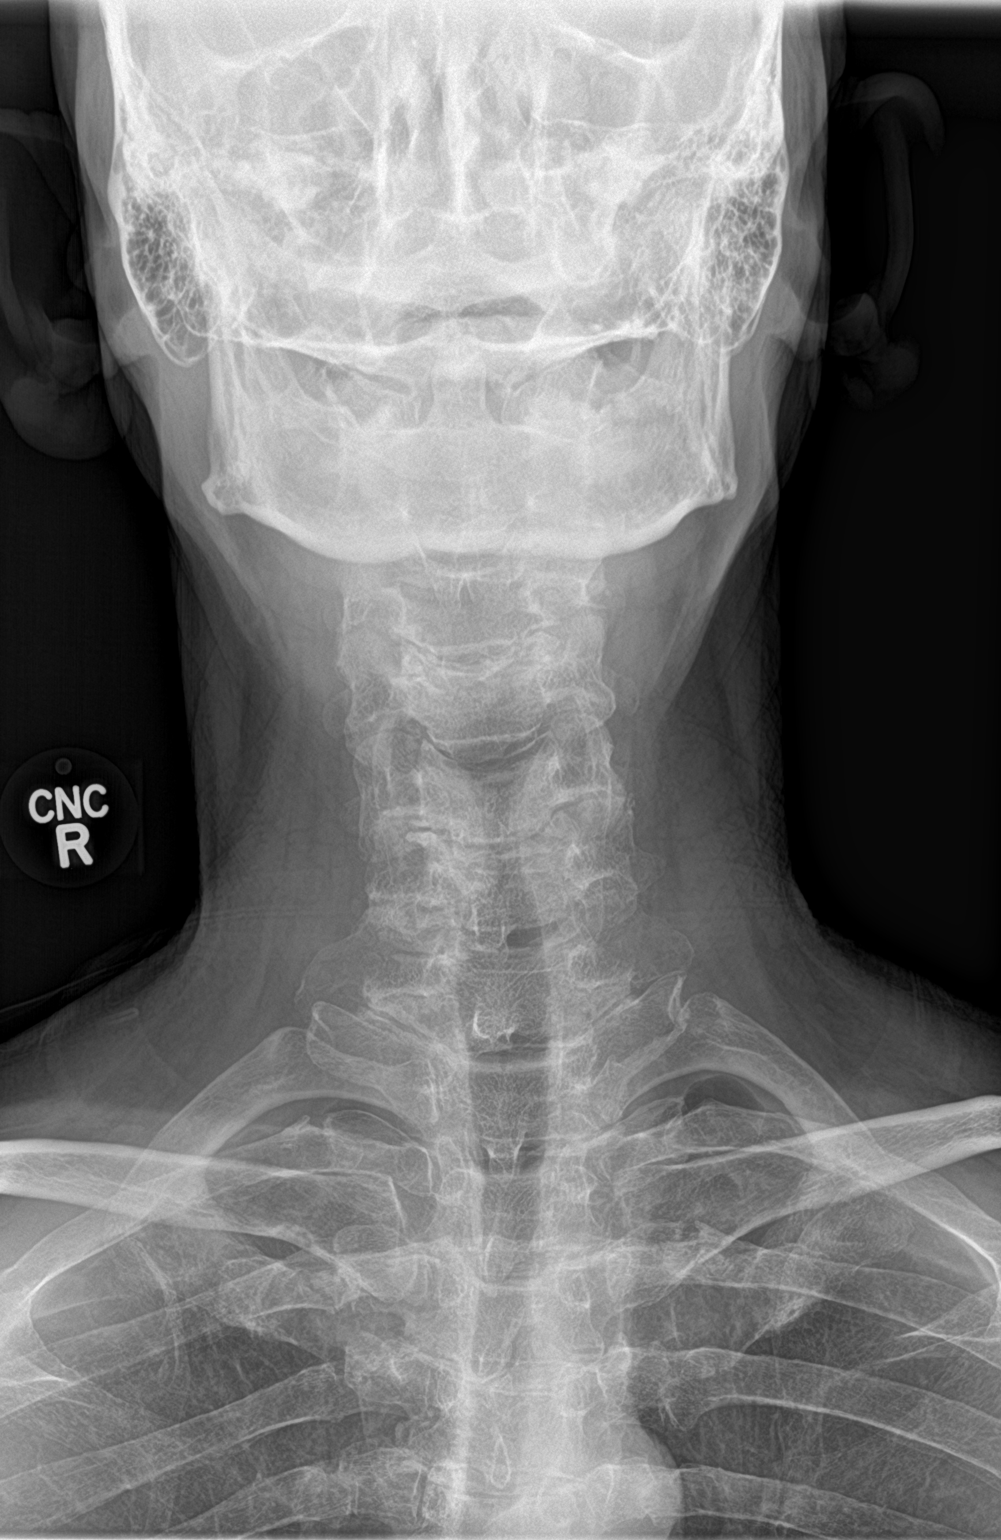
[im 3/3]
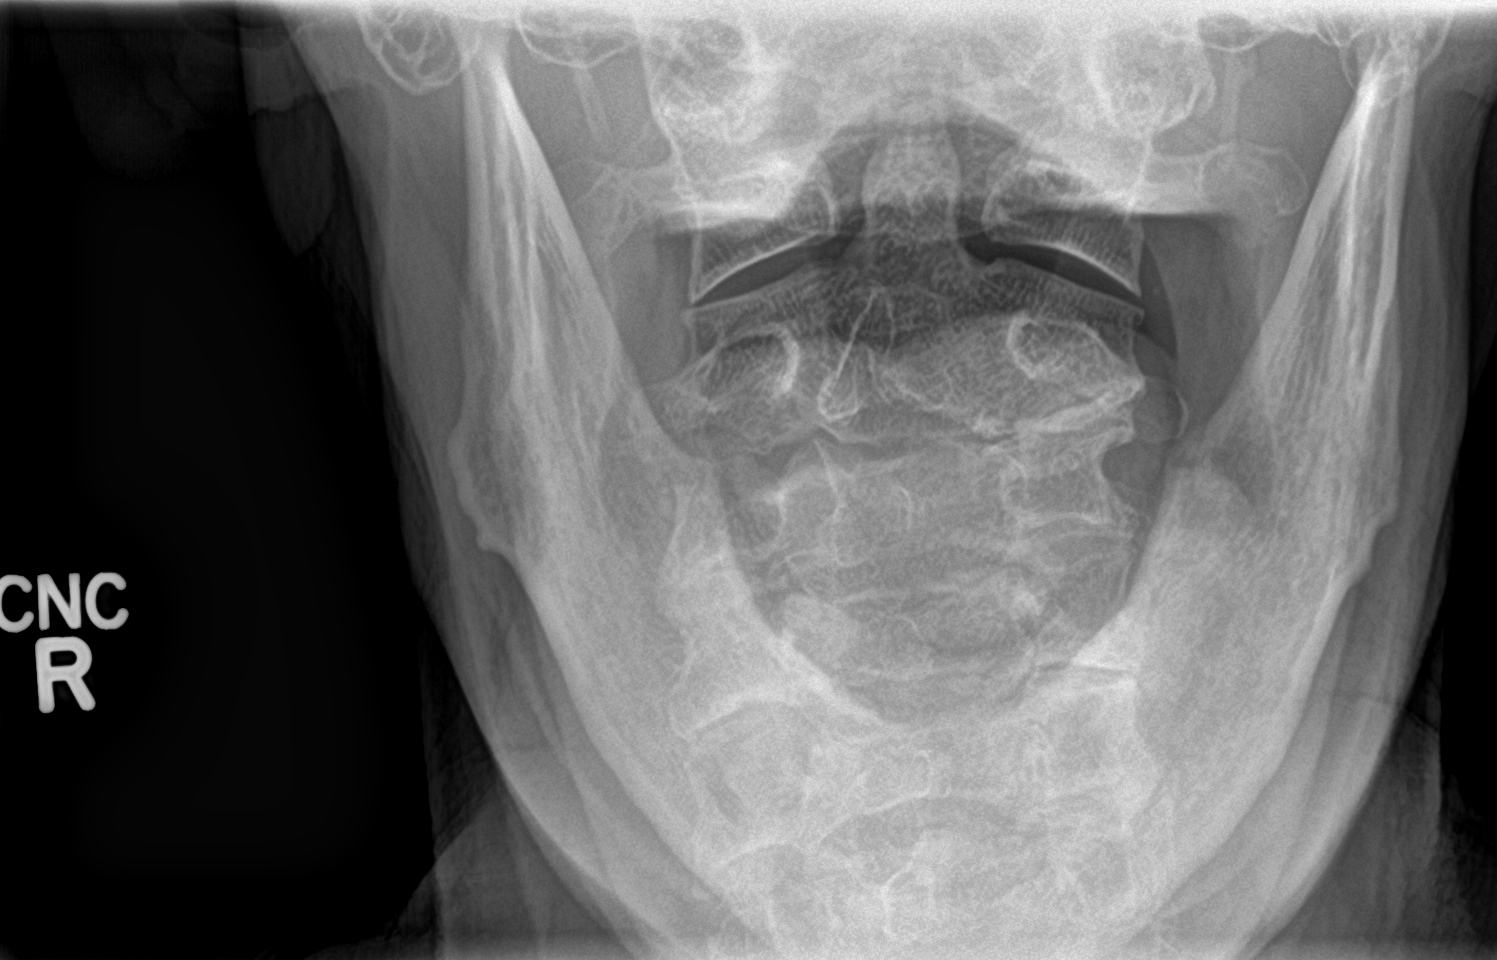

[3 of 3 positions shown; findings below may reference images not displayed]

FINDINGS: 2 mm retrolisthesis of C3 on C4. Otherwise normal alignment. Lateral
masses of C1 well aligned on C2. Vertebral body heights are normal.
There is disc space narrowing and endplate spurring at C3-C4 and
C5-C6. Mild C2-C3 facet hypertrophy. No fracture, evidence of focal
bone lesion or bony destruction. No prevertebral soft tissue edema.
Soft tissue planes are non suspicious.
IMPRESSION: 1. Degenerative disc disease at C3-C4 and C5-C6. Mild C2-C3 facet
hypertrophy.
2. Approximately 2 mm retrolisthesis of C3 on C4, likely
degenerative.

## 2020-11-21 ENCOUNTER — Other Ambulatory Visit: Payer: Self-pay

## 2020-11-21 ENCOUNTER — Emergency Department
Admission: EM | Admit: 2020-11-21 | Discharge: 2020-11-21 | Disposition: A | Discharge status: Home / Self Care | Payer: Medicaid Other | Attending: Emergency Medicine | Admitting: Emergency Medicine

## 2020-11-21 ENCOUNTER — Encounter: Payer: Self-pay | Admitting: Emergency Medicine

## 2020-11-21 DIAGNOSIS — E119 Type 2 diabetes mellitus without complications: Secondary | ICD-10-CM | POA: Diagnosis not present

## 2020-11-21 DIAGNOSIS — X58XXXA Exposure to other specified factors, initial encounter: Secondary | ICD-10-CM | POA: Insufficient documentation

## 2020-11-21 DIAGNOSIS — S39012A Strain of muscle, fascia and tendon of lower back, initial encounter: Secondary | ICD-10-CM | POA: Insufficient documentation

## 2020-11-21 DIAGNOSIS — M5441 Lumbago with sciatica, right side: Secondary | ICD-10-CM | POA: Diagnosis not present

## 2020-11-21 DIAGNOSIS — F17210 Nicotine dependence, cigarettes, uncomplicated: Secondary | ICD-10-CM | POA: Diagnosis not present

## 2020-11-21 DIAGNOSIS — S3992XA Unspecified injury of lower back, initial encounter: Secondary | ICD-10-CM | POA: Diagnosis present

## 2020-11-21 MED ORDER — OXYCODONE HCL 5 MG PO TABS: 5.0000 mg | ORAL_TABLET | Freq: Four times a day (QID) | ORAL | 0 refills | Status: DC | PRN

## 2020-11-21 MED ORDER — DEXAMETHASONE SODIUM PHOSPHATE 10 MG/ML IJ SOLN
10.0000 mg | Freq: Once | INTRAMUSCULAR | Status: AC
  Administered 2020-11-21: 10 mg via INTRAVENOUS
  Filled 2020-11-21: qty 1

## 2020-11-21 MED ORDER — PREDNISONE 10 MG PO TABS: 10.0000 mg | ORAL_TABLET | Freq: Every day | ORAL | 0 refills | Status: DC

## 2020-11-21 MED ORDER — CYCLOBENZAPRINE HCL 5 MG PO TABS: 5.0000 mg | ORAL_TABLET | Freq: Three times a day (TID) | ORAL | 0 refills | Status: DC | PRN

## 2020-11-21 MED ORDER — HYDROMORPHONE HCL 1 MG/ML IJ SOLN
1.0000 mg | Freq: Once | INTRAMUSCULAR | Status: AC
  Administered 2020-11-21: 1 mg via INTRAVENOUS
  Filled 2020-11-21: qty 1

## 2020-11-21 MED ORDER — ORPHENADRINE CITRATE 30 MG/ML IJ SOLN
60.0000 mg | Freq: Once | INTRAMUSCULAR | Status: AC
  Administered 2020-11-21: 60 mg via INTRAVENOUS
  Filled 2020-11-21: qty 2

## 2020-11-21 MED ORDER — SODIUM CHLORIDE 0.9 % IV SOLN
12.5000 mg | Freq: Once | INTRAVENOUS | Status: AC
  Administered 2020-11-21: 12.5 mg via INTRAVENOUS
  Filled 2020-11-21: qty 0.5

## 2020-11-21 NOTE — ED Triage Notes (Signed)
Pt to ED via POV with c/o R lower back pain that radiates down his R leg. Pt states hx of back problems. Pt states pain started approx 1 hr ago when he bent over to put a comforter on a bed. Pt states pain has progressively increased.

## 2020-11-21 NOTE — ED Provider Notes (Signed)
[facility] EMERGENCY DEPARTMENT Provider Note   CSN: [id number] Arrival date & time: 11/21/20  1315     History Chief Complaint  Patient presents with  . Back Pain    Terry Pruitt is a 60 y.o. male presents to the emerge department for evaluation of acute right lower back pain, tightness and spasms with radiation of numbness and tingling going down the right lateral hip, lateral leg and into the foot.  Patient states just prior to arrival he was bending over to put a comforter on bed when his back locked up.  He describes spasms, tightness with numbness and tingling on the right leg.  He has not any medications for his pain.  Pain is severe.  No loss of bowel or bladder function.  Denies any chest pain shortness of breath or abdominal pain.  HPI     Past Medical History:  Diagnosis Date  . Diabetes mellitus without complication (HCC)     There are no problems to display for this patient.   History reviewed. No pertinent surgical history.     History reviewed. No pertinent family history.  Social History   Tobacco Use  . Smoking status: Current Every Day Smoker    Packs/day: 1.00    Types: Cigarettes  . Smokeless tobacco: Never Used  Substance Use Topics  . Alcohol use: No  . Drug use: No    Home Medications Prior to Admission medications   Medication Sig Start Date End Date Taking? Authorizing Provider  cyclobenzaprine (FLEXERIL) 5 MG tablet Take 1-2 tablets (5-10 mg total) by mouth 3 (three) times daily as needed for muscle spasms. 11/21/20  Yes Evon Slack, PA-C  oxyCODONE (ROXICODONE) 5 MG immediate release tablet Take 1 tablet (5 mg total) by mouth every 6 (six) hours as needed. 11/21/20 11/21/21 Yes Evon Slack, PA-C  predniSONE (DELTASONE) 10 MG tablet Take 1 tablet (10 mg total) by mouth daily. 6,5,4,3,2,1 six day taper 11/21/20  Yes Evon Slack, PA-C  azithromycin (ZITHROMAX) 250 MG tablet 2 tablets today, then 1 tablet for  the next 4 days. 08/28/15   Chinita Pester, FNP    Allergies    Patient has no known allergies.  Review of Systems   Review of Systems  Constitutional: Negative for chills, fatigue and fever.  Respiratory: Negative for cough and shortness of breath.   Cardiovascular: Negative for chest pain.  Gastrointestinal: Negative for abdominal pain, nausea and vomiting.  Genitourinary: Negative for dysuria, flank pain and frequency.  Musculoskeletal: Positive for back pain, gait problem and myalgias.  Skin: Negative for rash and wound.    Physical Exam Updated Vital Signs BP (!) 159/104 (BP Location: Left Arm)   Pulse 100   Temp 97.9 F (36.6 C) (Oral)   Resp 18   Ht 6' (1.829 m)   Wt 75.8 kg   SpO2 98%   BMI 22.65 kg/m   Physical Exam Constitutional:      Appearance: He is well-developed.  HENT:     Head: Normocephalic and atraumatic.  Eyes:     Conjunctiva/sclera: Conjunctivae normal.  Cardiovascular:     Rate and Rhythm: Normal rate.     Pulses: Normal pulses.  Pulmonary:     Effort: Pulmonary effort is normal. No respiratory distress.     Breath sounds: No wheezing or rales.  Musculoskeletal:        General: Normal range of motion.     Cervical back: Normal range of  motion.     Comments: Examination of the lumbar spine shows no spinous process tenderness.  No SI or sacral tenderness.  He is tender on the right paravertebral muscles.  He has good range of motion of the hips with internal X rotation with no reproduction of lower back, buttocks pain with radicular symptoms.  Sensation is intact distally.  No saddle anesthesia.  Normal ankle plantar flexion dorsiflexion bilaterally with normal EHL bilaterally  Skin:    General: Skin is warm.     Findings: No rash.  Neurological:     General: No focal deficit present.     Mental Status: He is alert and oriented to person, place, and time.  Psychiatric:        Behavior: Behavior normal.        Thought Content: Thought  content normal.     ED Results / Procedures / Treatments   Labs (all labs ordered are listed, but only abnormal results are displayed) Labs Reviewed - No data to display  EKG None  Radiology No results found.  Procedures Procedures   Medications Ordered in ED Medications  promethazine (PHENERGAN) 12.5 mg in sodium chloride 0.9 % 50 mL IVPB (has no administration in time range)  HYDROmorphone (DILAUDID) injection 1 mg (1 mg Intravenous Given 11/21/20 1416)  orphenadrine (NORFLEX) injection 60 mg (60 mg Intravenous Given 11/21/20 1418)  dexamethasone (DECADRON) injection 10 mg (10 mg Intravenous Given 11/21/20 1442)    ED Course  I have reviewed the triage vital signs and the nursing notes.  Pertinent labs & imaging results that were available during my care of the patient were reviewed by me and considered in my medical decision making (see chart for details).    MDM Rules/Calculators/A&P                          60 year old male with underlying degenerative disc disease and arthritis.  Nontraumatic right-sided lower back pain with sciatica.  No neurological deficits.  Pain severe, improved with IV medications.  He is able to walk but with discomfort.  He has no spinous process tenderness.  Vital signs are stable.  Patient discharged home with steroids, pain medication and muscle relaxer.  He is educated on follow-up with orthopedics.   Final Clinical Impression(s) / ED Diagnoses Final diagnoses:  Acute right-sided low back pain with right-sided sciatica  Strain of lumbar region, initial encounter    Rx / DC Orders ED Discharge Orders         Ordered    cyclobenzaprine (FLEXERIL) 5 MG tablet  3 times daily PRN        11/21/20 1446    predniSONE (DELTASONE) 10 MG tablet  Daily        11/21/20 1446    oxyCODONE (ROXICODONE) 5 MG immediate release tablet  Every 6 hours PRN        11/21/20 1446           Terry Pruitt 11/21/20 1452    Sharyn Creamer,  MD 11/22/20 1307

## 2020-11-21 NOTE — Discharge Instructions (Addendum)
Please use walker as needed for ambulation.  Apply heating pad to the lower back.  Take medications as prescribed.  Return to the ER for any weakness worsening symptoms or urgent changes in your health.  Please follow-up with orthopedics in 1 week.

## 2020-12-03 ENCOUNTER — Other Ambulatory Visit: Payer: Self-pay

## 2020-12-03 ENCOUNTER — Encounter: Payer: Self-pay | Admitting: Intensive Care

## 2020-12-03 ENCOUNTER — Emergency Department: Payer: Medicaid Other

## 2020-12-03 ENCOUNTER — Emergency Department
Admission: EM | Admit: 2020-12-03 | Discharge: 2020-12-03 | Disposition: A | Discharge status: Home / Self Care | Payer: Medicaid Other | Attending: Emergency Medicine | Admitting: Emergency Medicine

## 2020-12-03 DIAGNOSIS — M5441 Lumbago with sciatica, right side: Secondary | ICD-10-CM | POA: Insufficient documentation

## 2020-12-03 DIAGNOSIS — E119 Type 2 diabetes mellitus without complications: Secondary | ICD-10-CM | POA: Insufficient documentation

## 2020-12-03 DIAGNOSIS — I10 Essential (primary) hypertension: Secondary | ICD-10-CM | POA: Diagnosis not present

## 2020-12-03 DIAGNOSIS — J449 Chronic obstructive pulmonary disease, unspecified: Secondary | ICD-10-CM | POA: Diagnosis not present

## 2020-12-03 DIAGNOSIS — M545 Low back pain, unspecified: Secondary | ICD-10-CM | POA: Diagnosis present

## 2020-12-03 DIAGNOSIS — F17210 Nicotine dependence, cigarettes, uncomplicated: Secondary | ICD-10-CM | POA: Insufficient documentation

## 2020-12-03 HISTORY — DX: Pure hypercholesterolemia, unspecified: E78.00

## 2020-12-03 HISTORY — DX: Essential (primary) hypertension: I10

## 2020-12-03 HISTORY — DX: Sciatica, unspecified side: M54.30

## 2020-12-03 HISTORY — DX: Chronic obstructive pulmonary disease, unspecified: J44.9

## 2020-12-03 HISTORY — DX: Unspecified osteoarthritis, unspecified site: M19.90

## 2020-12-03 LAB — CBG MONITORING, ED: Glucose-Capillary: 246 mg/dL — ABNORMAL HIGH (ref 70–99)

## 2020-12-03 IMAGING — CR DG LUMBAR SPINE 2-3V
3 series · 3 of 3 positions shown · non-contrast
Comparison: Lumbar radiograph 07/07/2020

CLINICAL DATA: Lumbar radiculopathy. Recent fall. Right-sided
sciatica for 1 week. Pain onset after bending over.

EXAM:
LUMBAR SPINE - 2-3 VIEW

[l-spine ap]
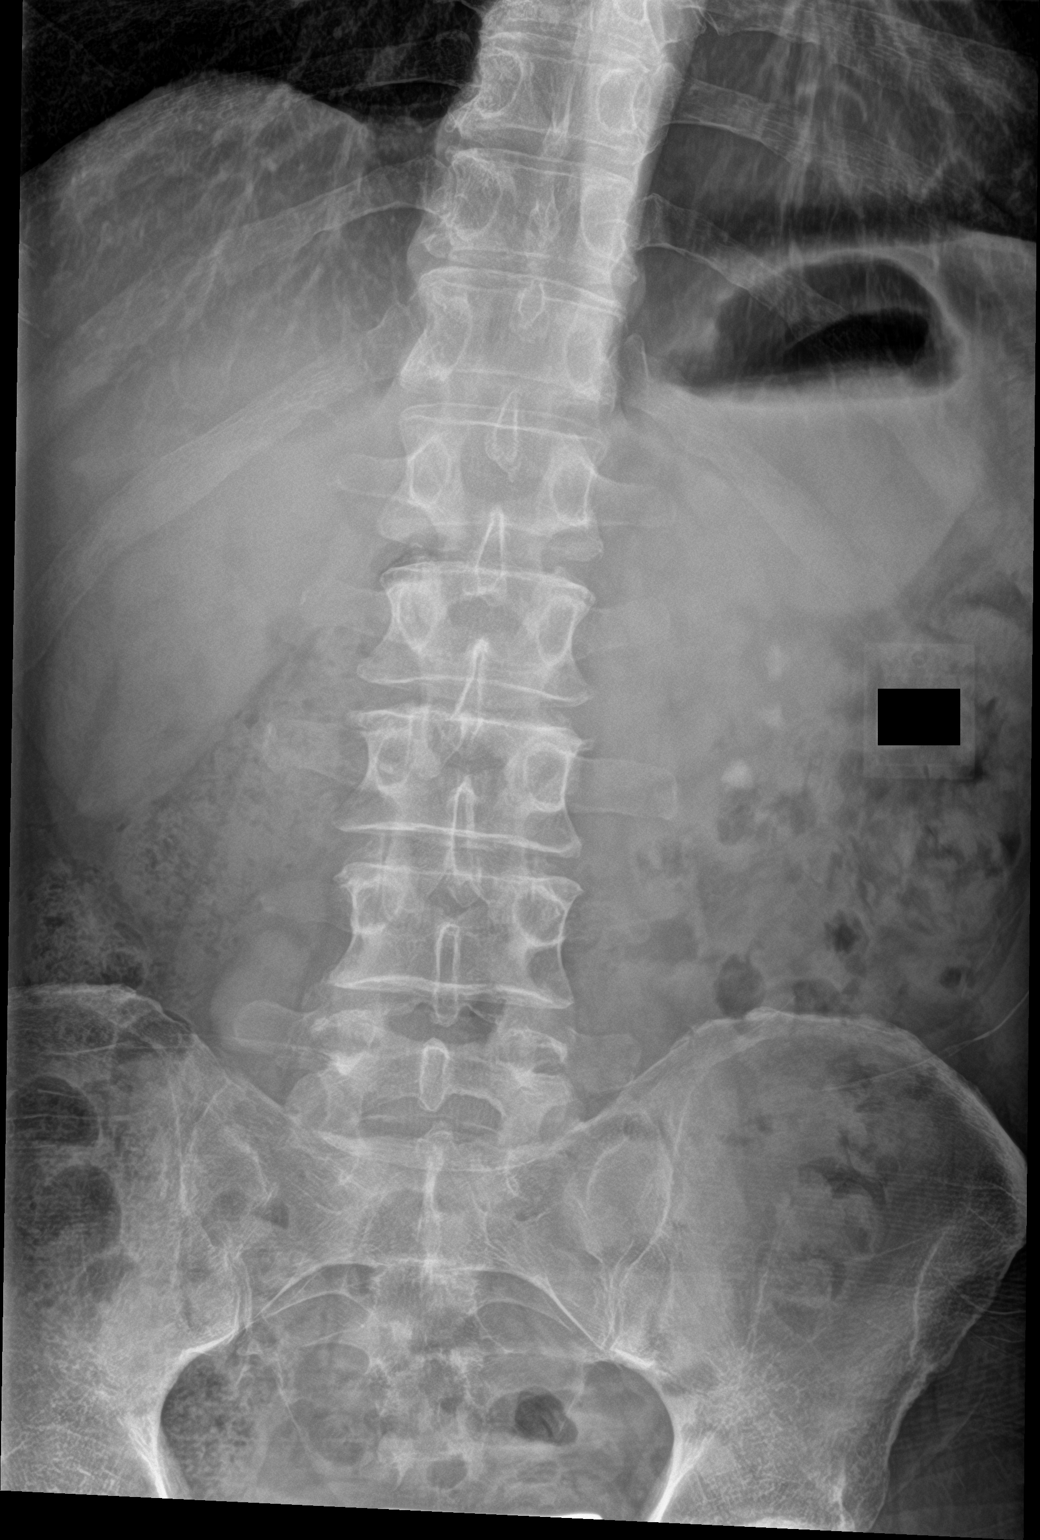

[l-spine lat]
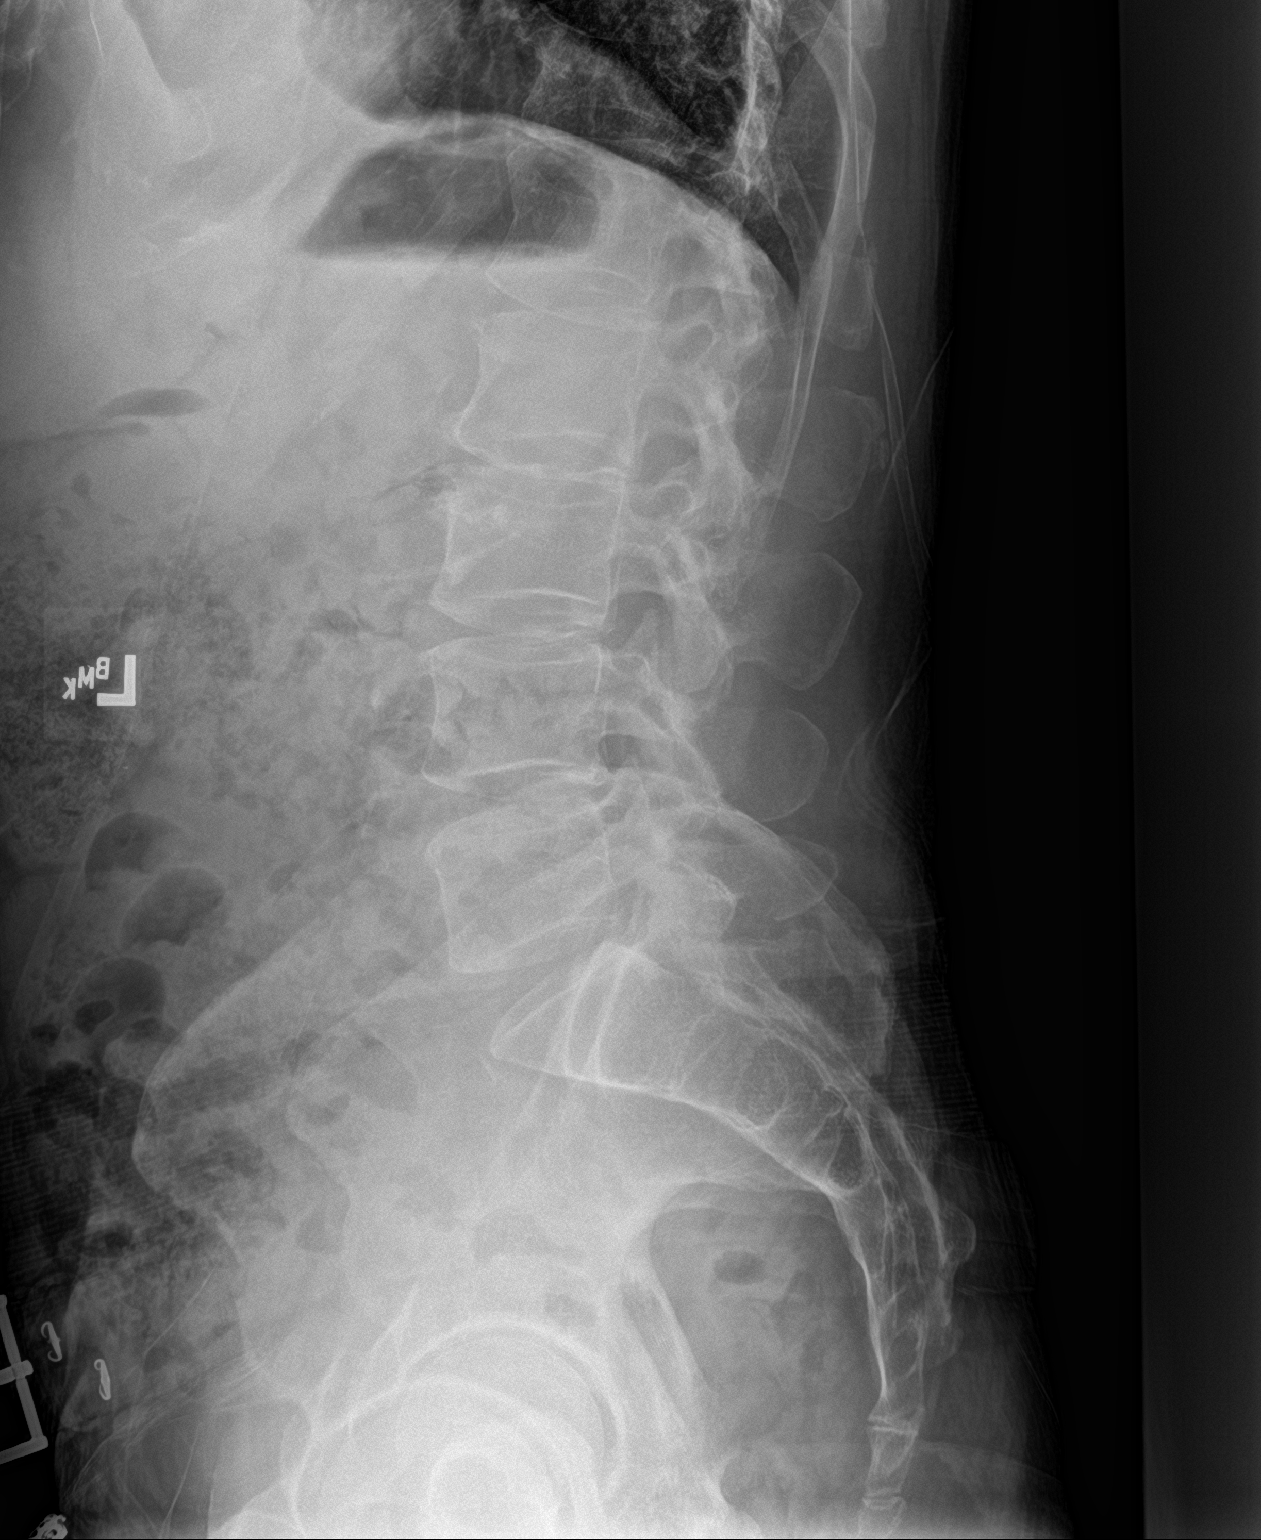

[l-spine spot]
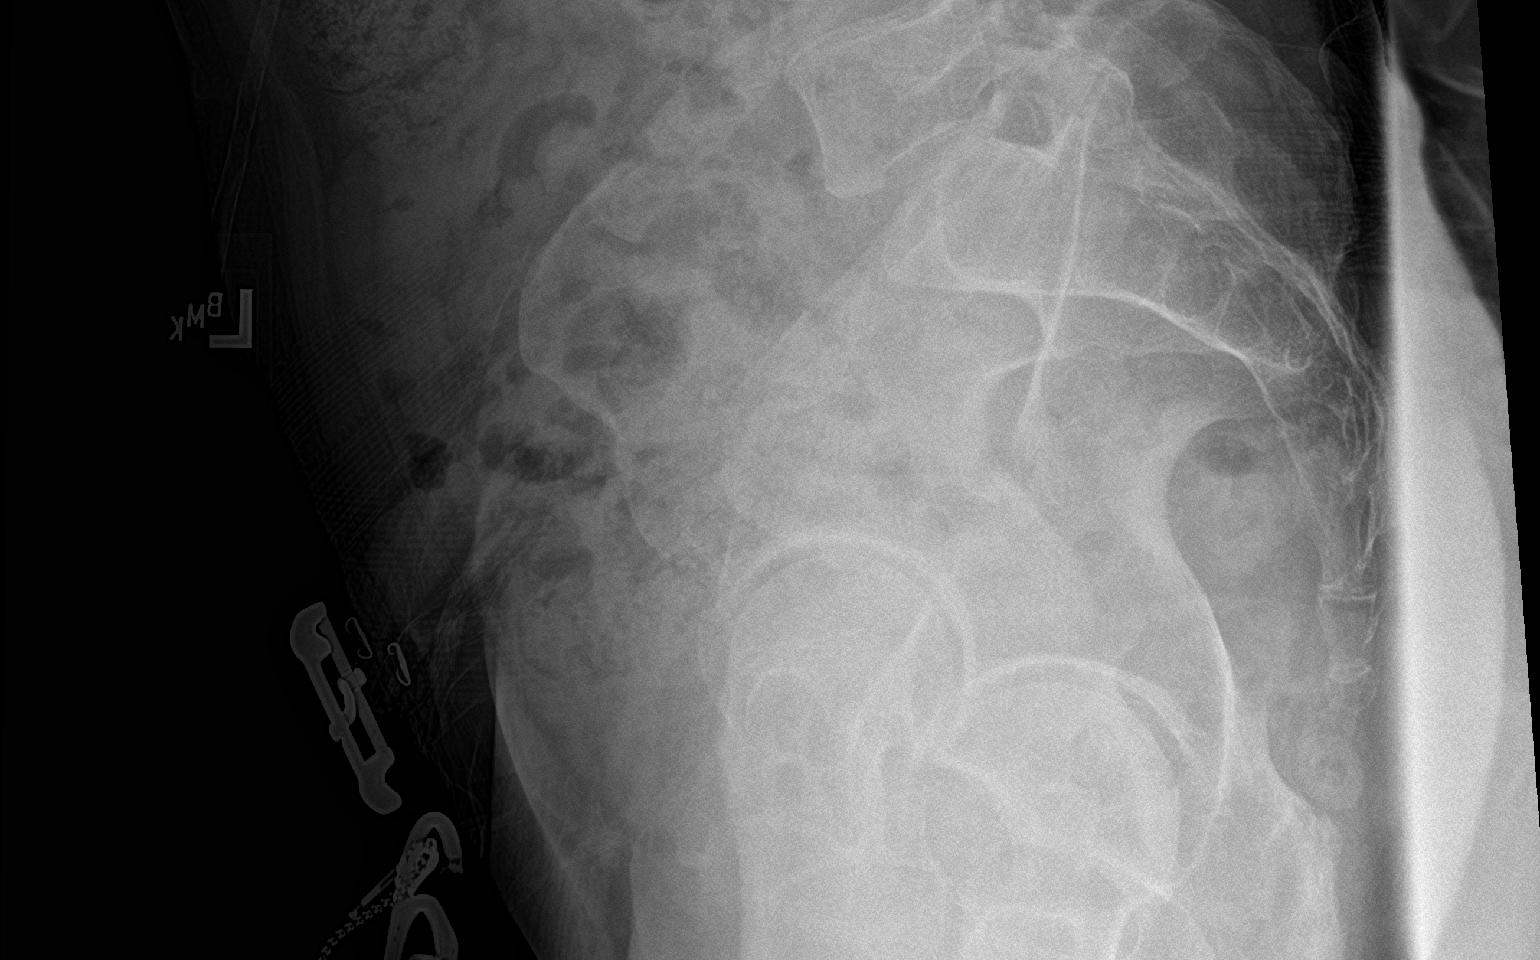

[3 of 3 positions shown; findings below may reference images not displayed]

FINDINGS: Normal alignment. Five non-rib-bearing lumbar vertebra. No fracture.
Vertebral body heights are normal. Disc space narrowing and endplate
spurring at L2-L3. Additional endplate spurring at L1-L2 and L3-L4.
Sacroiliac joints are congruent. Multiple left renal calculi.
IMPRESSION: 1. Mild degenerative disc disease in the lumbar spine, most
prominent at L2-L3. No acute osseous abnormality.
2. Multiple left renal calculi.

## 2020-12-03 MED ORDER — PREDNISONE 10 MG (21) PO TBPK: ORAL_TABLET | Freq: Every day | ORAL | 0 refills | Status: AC

## 2020-12-03 MED ORDER — KETOROLAC TROMETHAMINE 30 MG/ML IJ SOLN
15.0000 mg | Freq: Once | INTRAMUSCULAR | Status: AC
  Administered 2020-12-03: 15 mg via INTRAVENOUS
  Filled 2020-12-03: qty 1

## 2020-12-03 MED ORDER — OXYCODONE-ACETAMINOPHEN 5-325 MG PO TABS
1.0000 | ORAL_TABLET | Freq: Once | ORAL | Status: AC
  Administered 2020-12-03: 1 via ORAL
  Filled 2020-12-03: qty 1

## 2020-12-03 MED ORDER — PREDNISONE 10 MG (21) PO TBPK: ORAL_TABLET | Freq: Every day | ORAL | 0 refills | Status: DC

## 2020-12-03 MED ORDER — METHOCARBAMOL 500 MG PO TABS: 500.0000 mg | ORAL_TABLET | Freq: Three times a day (TID) | ORAL | 0 refills | Status: AC | PRN

## 2020-12-03 NOTE — ED Notes (Signed)
See triage note  Presents with "sciatica" States he was seen about 1 week ago for same  States conts;'s to have pain but not as bad as last time

## 2020-12-03 NOTE — ED Notes (Signed)
Provided discharge instructions. Verbalized understanding. Wheeled to exit. Patient requested RX to go to [facility]. PA notified.

## 2020-12-03 NOTE — Discharge Instructions (Signed)
Take tapered steroid as directed.  Please follow up with neurosurgeon, Dr. Myer Haff.

## 2020-12-03 NOTE — ED Provider Notes (Signed)
[facility]-EMERGENCY DEPARTMENT  ____________________________________________  Time seen: Approximately 6:03 PM  I have reviewed the triage vital signs and the nursing notes.   HISTORY  Chief Complaint Sciatica   Historian Patient     HPI Terry Pruitt is a 60 y.o. male presents to the emergency department with low back pain that radiates down the posterior aspect of the right leg.  Patient states that he bent over to make his bed when he felt a sudden burning pain and discomfort.  He denies bowel or bladder incontinence or saddle anesthesia.  Patient states that he was seen and evaluated on 327 and was started on prednisone.  He reports that his pain improved but has not completely gone away and he is concerned.  He states that he cannot get comfortable at night.  No chest pain, chest tightness or abdominal pain.   Past Medical History:  Diagnosis Date  . COPD (chronic obstructive pulmonary disease) (HCC)   . Degenerative arthritis   . Diabetes mellitus without complication (HCC)   . [place] cholesterol   . Hypertension   . Sciatic pain      Immunizations up to date:  Yes.     Past Medical History:  Diagnosis Date  . COPD (chronic obstructive pulmonary disease) (HCC)   . Degenerative arthritis   . Diabetes mellitus without complication (HCC)   . [place] cholesterol   . Hypertension   . Sciatic pain     There are no problems to display for this patient.   History reviewed. No pertinent surgical history.  Prior to Admission medications   Medication Sig Start Date End Date Taking? Authorizing Provider  methocarbamol (ROBAXIN) 500 MG tablet Take 1 tablet (500 mg total) by mouth every 8 (eight) hours as needed for up to 5 days. 12/03/20 12/08/20 Yes Pia Mau M, PA-C  predniSONE (STERAPRED UNI-PAK 21 TAB) 10 MG (21) TBPK tablet Take by mouth daily. 6,5,4,3,2,1 12/03/20  Yes Pia Mau M, PA-C  predniSONE (STERAPRED UNI-PAK 21 TAB) 10 MG (21) TBPK tablet Take by mouth daily  for 6 days. 6,5,4,3,2,1 12/03/20 12/09/20  Orvil Feil, PA-C    Allergies Patient has no known allergies.  History reviewed. No pertinent family history.  Social History Social History   Tobacco Use  . Smoking status: Current Every Day Smoker    Packs/day: 1.00    Types: Cigarettes  . Smokeless tobacco: Never Used  Substance Use Topics  . Alcohol use: No  . Drug use: Yes    Types: Marijuana     Review of Systems  Constitutional: No fever/chills Eyes:  No discharge ENT: No upper respiratory complaints. Respiratory: no cough. No SOB/ use of accessory muscles to breath Gastrointestinal:   No nausea, no vomiting.  No diarrhea.  No constipation. Musculoskeletal: Patient has low back pain.  Skin: Negative for rash, abrasions, lacerations, ecchymosis.    ____________________________________________   PHYSICAL EXAM:  VITAL SIGNS: ED Triage Vitals  Enc Vitals Group     BP 12/03/20 1741 (!) 167/104     Pulse Rate 12/03/20 1741 (!) 104     Resp 12/03/20 1741 20     Temp 12/03/20 1741 98.5 F (36.9 C)     Temp Source 12/03/20 1741 Oral     SpO2 12/03/20 1741 98 %     Weight 12/03/20 1741 165 lb (74.8 kg)     Height 12/03/20 1741 6' (1.829 m)     Head Circumference --      Peak Flow --  Pain Score 12/03/20 1745 10     Pain Loc --      Pain Edu? --      Excl. in GC? --      Constitutional: Alert and oriented. Well appearing and in no acute distress. Eyes: Conjunctivae are normal. PERRL. EOMI. Head: Atraumatic. ENT:      Nose: No congestion/rhinnorhea.      Mouth/Throat: Mucous membranes are moist.  Neck: No stridor.  No cervical spine tenderness to palpation. Cardiovascular: Normal rate, regular rhythm. Normal S1 and S2.  Good peripheral circulation. Respiratory: Normal respiratory effort without tachypnea or retractions. Lungs CTAB. Good air entry to the bases with no decreased or absent breath sounds Gastrointestinal: Bowel sounds x 4 quadrants. Soft and  nontender to palpation. No guarding or rigidity. No distention. Musculoskeletal: Full range of motion to all extremities. No obvious deformities noted Neurologic:  Normal for age. No gross focal neurologic deficits are appreciated.  Skin:  Skin is warm, dry and intact. No rash noted. Psychiatric: Mood and affect are normal for age. Speech and behavior are normal.   ____________________________________________   LABS (all labs ordered are listed, but only abnormal results are displayed)  Labs Reviewed  CBG MONITORING, ED - Abnormal; Notable for the following components:      Result Value   Glucose-Capillary 246 (*)    All other components within normal limits   ____________________________________________  EKG   ____________________________________________  RADIOLOGY Geraldo Pitter, personally viewed and evaluated these images (plain radiographs) as part of my medical decision making, as well as reviewing the written report by the radiologist.    DG Lumbar Spine 2-3 Views  Result Date: 12/03/2020 CLINICAL DATA:  Lumbar radiculopathy. Recent fall. Right-sided sciatica for 1 week. Pain onset after bending over. EXAM: LUMBAR SPINE - 2-3 VIEW COMPARISON:  Lumbar radiograph 07/07/2020 FINDINGS: Normal alignment. Five non-rib-bearing lumbar vertebra. No fracture. Vertebral body heights are normal. Disc space narrowing and endplate spurring at L2-L3. Additional endplate spurring at L1-L2 and L3-L4. Sacroiliac joints are congruent. Multiple left renal calculi. IMPRESSION: 1. Mild degenerative disc disease in the lumbar spine, most prominent at L2-L3. No acute osseous abnormality. 2. Multiple left renal calculi. Electronically Signed   By: Narda Rutherford M.D.   On: 12/03/2020 18:34    ____________________________________________    PROCEDURES  Procedure(s) performed:     Procedures     Medications  ketorolac (TORADOL) 30 MG/ML injection 15 mg (15 mg Intravenous Given  12/03/20 1831)  oxyCODONE-acetaminophen (PERCOCET/ROXICET) 5-325 MG per tablet 1 tablet (1 tablet Oral Given 12/03/20 1851)     ____________________________________________   INITIAL IMPRESSION / ASSESSMENT AND PLAN / ED COURSE  Pertinent labs & imaging results that were available during my care of the patient were reviewed by me and considered in my medical decision making (see chart for details).      Assessment and Plan:  Lumbar radiculopathy  60 year old male presents to the emergency department with numbness and tingling that radiates down the posterior aspect of the lower extremities, right side worse than left.  Patient was hypertensive at triage but vital signs were otherwise reassuring.  X-ray of the lumbar spine showed no acute bony abnormality.  Explained to patient that sometimes a longer taper of prednisone is needed in order to improve radicular back pain.  Patient's blood glucose level was 246.  We will restart patient on a 6-day taper of prednisone and advised to continue using a muscle relaxer as needed at night.  Patient was given 15 mg of Toradol IM and he reported some improvement in his pain.  Patient requested 5 mg oxycodone tablets at discharge stating that he had been prescribed similar medication on March 28.  I explained to patient that I could not refill his prescription for Percocet but that I would give him a single dose of Percocet in the emergency department for pain. I recommended follow-up with neurosurgery as needed for further care and management.  All patient questions were answered.  ____________________________________________  FINAL CLINICAL IMPRESSION(S) / ED DIAGNOSES  Final diagnoses:  Acute bilateral low back pain with right-sided sciatica      NEW MEDICATIONS STARTED DURING THIS VISIT:  ED Discharge Orders         Ordered    predniSONE (STERAPRED UNI-PAK 21 TAB) 10 MG (21) TBPK tablet  Daily,   Status:  Discontinued        12/03/20 1849     predniSONE (STERAPRED UNI-PAK 21 TAB) 10 MG (21) TBPK tablet  Daily        12/03/20 1850    predniSONE (STERAPRED UNI-PAK 21 TAB) 10 MG (21) TBPK tablet  Daily        12/03/20 1914    methocarbamol (ROBAXIN) 500 MG tablet  Every 8 hours PRN        12/03/20 1914              This chart was dictated using voice recognition software/Dragon. Despite best efforts to proofread, errors can occur which can change the meaning. Any change was purely unintentional.     Gasper Lloyd 12/03/20 2111    Phineas Semen, MD 12/03/20 2245

## 2020-12-03 NOTE — ED Triage Notes (Signed)
Patient c/o right sided sciatica that started about a week ago. Reports bending over and then pain started. C/o pain in buttock and hip that radiates down leg

## 2020-12-18 DIAGNOSIS — Z8249 Family history of ischemic heart disease and other diseases of the circulatory system: Secondary | ICD-10-CM

## 2020-12-18 DIAGNOSIS — Z833 Family history of diabetes mellitus: Secondary | ICD-10-CM

## 2020-12-18 DIAGNOSIS — Z885 Allergy status to narcotic agent status: Secondary | ICD-10-CM

## 2020-12-18 DIAGNOSIS — Z825 Family history of asthma and other chronic lower respiratory diseases: Secondary | ICD-10-CM

## 2020-12-18 DIAGNOSIS — L03114 Cellulitis of left upper limb: Principal | ICD-10-CM | POA: Diagnosis present

## 2020-12-18 DIAGNOSIS — E1165 Type 2 diabetes mellitus with hyperglycemia: Secondary | ICD-10-CM | POA: Diagnosis present

## 2020-12-18 DIAGNOSIS — Z20822 Contact with and (suspected) exposure to covid-19: Secondary | ICD-10-CM | POA: Diagnosis present

## 2020-12-18 DIAGNOSIS — F17210 Nicotine dependence, cigarettes, uncomplicated: Secondary | ICD-10-CM | POA: Diagnosis present

## 2020-12-18 DIAGNOSIS — Z23 Encounter for immunization: Secondary | ICD-10-CM

## 2020-12-19 ENCOUNTER — Inpatient Hospital Stay
Admission: EM | Admit: 2020-12-19 | Discharge: 2020-12-21 | DRG: 603 | Disposition: A | Discharge status: Home / Self Care | Payer: Medicaid Other | Attending: Internal Medicine | Admitting: Internal Medicine

## 2020-12-19 ENCOUNTER — Other Ambulatory Visit: Payer: Self-pay

## 2020-12-19 ENCOUNTER — Emergency Department: Payer: Medicaid Other

## 2020-12-19 ENCOUNTER — Encounter: Payer: Self-pay | Admitting: Emergency Medicine

## 2020-12-19 DIAGNOSIS — J449 Chronic obstructive pulmonary disease, unspecified: Secondary | ICD-10-CM | POA: Diagnosis present

## 2020-12-19 DIAGNOSIS — Z8249 Family history of ischemic heart disease and other diseases of the circulatory system: Secondary | ICD-10-CM | POA: Diagnosis not present

## 2020-12-19 DIAGNOSIS — Z833 Family history of diabetes mellitus: Secondary | ICD-10-CM | POA: Diagnosis not present

## 2020-12-19 DIAGNOSIS — L03114 Cellulitis of left upper limb: Secondary | ICD-10-CM

## 2020-12-19 DIAGNOSIS — Z20822 Contact with and (suspected) exposure to covid-19: Secondary | ICD-10-CM | POA: Diagnosis present

## 2020-12-19 DIAGNOSIS — Z23 Encounter for immunization: Secondary | ICD-10-CM | POA: Diagnosis not present

## 2020-12-19 DIAGNOSIS — I10 Essential (primary) hypertension: Secondary | ICD-10-CM | POA: Diagnosis present

## 2020-12-19 DIAGNOSIS — E119 Type 2 diabetes mellitus without complications: Secondary | ICD-10-CM

## 2020-12-19 DIAGNOSIS — E43 Unspecified severe protein-calorie malnutrition: Secondary | ICD-10-CM | POA: Insufficient documentation

## 2020-12-19 DIAGNOSIS — F17210 Nicotine dependence, cigarettes, uncomplicated: Secondary | ICD-10-CM | POA: Diagnosis present

## 2020-12-19 DIAGNOSIS — Z720 Tobacco use: Secondary | ICD-10-CM | POA: Diagnosis present

## 2020-12-19 DIAGNOSIS — Z885 Allergy status to narcotic agent status: Secondary | ICD-10-CM | POA: Diagnosis not present

## 2020-12-19 DIAGNOSIS — Z825 Family history of asthma and other chronic lower respiratory diseases: Secondary | ICD-10-CM | POA: Diagnosis not present

## 2020-12-19 DIAGNOSIS — E1165 Type 2 diabetes mellitus with hyperglycemia: Secondary | ICD-10-CM | POA: Diagnosis present

## 2020-12-19 LAB — CBC WITH DIFFERENTIAL/PLATELET
Abs Immature Granulocytes: 0.02 10*3/uL (ref 0.00–0.07)
Basophils Absolute: 0 10*3/uL (ref 0.0–0.1)
Basophils Relative: 0 %
Eosinophils Absolute: 0.2 10*3/uL (ref 0.0–0.5)
Eosinophils Relative: 2 %
HCT: 40.7 % (ref 39.0–52.0)
Hemoglobin: 13.8 g/dL (ref 13.0–17.0)
Immature Granulocytes: 0 %
Lymphocytes Relative: 14 %
Lymphs Abs: 1.5 10*3/uL (ref 0.7–4.0)
MCH: 32.2 pg (ref 26.0–34.0)
MCHC: 33.9 g/dL (ref 30.0–36.0)
MCV: 95.1 fL (ref 80.0–100.0)
Monocytes Absolute: 0.9 10*3/uL (ref 0.1–1.0)
Monocytes Relative: 9 %
Neutro Abs: 7.9 10*3/uL — ABNORMAL HIGH (ref 1.7–7.7)
Neutrophils Relative %: 75 %
Platelets: 229 10*3/uL (ref 150–400)
RBC: 4.28 MIL/uL (ref 4.22–5.81)
RDW: 12.7 % (ref 11.5–15.5)
WBC: 10.7 10*3/uL — ABNORMAL HIGH (ref 4.0–10.5)
nRBC: 0 % (ref 0.0–0.2)

## 2020-12-19 LAB — URINE DRUG SCREEN, QUALITATIVE (ARMC ONLY)
Amphetamines, Ur Screen: NOT DETECTED
Barbiturates, Ur Screen: NOT DETECTED
Benzodiazepine, Ur Scrn: NOT DETECTED
Cannabinoid 50 Ng, Ur ~~LOC~~: NOT DETECTED
Cocaine Metabolite,Ur ~~LOC~~: POSITIVE — AB
MDMA (Ecstasy)Ur Screen: NOT DETECTED
Methadone Scn, Ur: NOT DETECTED
Opiate, Ur Screen: POSITIVE — AB
Phencyclidine (PCP) Ur S: NOT DETECTED
Tricyclic, Ur Screen: NOT DETECTED

## 2020-12-19 LAB — APTT: aPTT: 28 seconds (ref 24–36)

## 2020-12-19 LAB — COMPREHENSIVE METABOLIC PANEL
ALT: 16 U/L (ref 0–44)
AST: 14 U/L — ABNORMAL LOW (ref 15–41)
Albumin: 3.6 g/dL (ref 3.5–5.0)
Alkaline Phosphatase: 73 U/L (ref 38–126)
Anion gap: 9 (ref 5–15)
BUN: 20 mg/dL (ref 6–20)
CO2: 25 mmol/L (ref 22–32)
Calcium: 9 mg/dL (ref 8.9–10.3)
Chloride: 103 mmol/L (ref 98–111)
Creatinine, Ser: 1.12 mg/dL (ref 0.61–1.24)
GFR, Estimated: >60 mL/min (ref >60)
Glucose, Bld: 196 mg/dL — ABNORMAL HIGH (ref 70–99)
Potassium: 3.4 mmol/L — ABNORMAL LOW (ref 3.5–5.1)
Sodium: 137 mmol/L (ref 135–145)
Total Bilirubin: 0.6 mg/dL (ref 0.3–1.2)
Total Protein: 6.7 g/dL (ref 6.5–8.1)

## 2020-12-19 LAB — HEMOGLOBIN A1C
Hgb A1c MFr Bld: 7.3 % — ABNORMAL HIGH (ref 4.8–5.6)
Mean Plasma Glucose: 162.81 mg/dL

## 2020-12-19 LAB — SEDIMENTATION RATE: Sed Rate: 46 mm/hr — ABNORMAL HIGH (ref 0–20)

## 2020-12-19 LAB — RESP PANEL BY RT-PCR (FLU A&B, COVID) ARPGX2
Influenza A by PCR: NEGATIVE
Influenza B by PCR: NEGATIVE
SARS Coronavirus 2 by RT PCR: NEGATIVE

## 2020-12-19 LAB — C-REACTIVE PROTEIN: CRP: 9 mg/dL — ABNORMAL HIGH (ref <1.0)

## 2020-12-19 LAB — GLUCOSE, CAPILLARY: Glucose-Capillary: 163 mg/dL — ABNORMAL HIGH (ref 70–99)

## 2020-12-19 LAB — CBG MONITORING, ED: Glucose-Capillary: 189 mg/dL — ABNORMAL HIGH (ref 70–99)

## 2020-12-19 LAB — PROTIME-INR
INR: 1 (ref 0.8–1.2)
Prothrombin Time: 13.5 seconds (ref 11.4–15.2)

## 2020-12-19 LAB — LACTIC ACID, PLASMA: Lactic Acid, Venous: 1.3 mmol/L (ref 0.5–1.9)

## 2020-12-19 LAB — HIV ANTIBODY (ROUTINE TESTING W REFLEX): HIV Screen 4th Generation wRfx: NONREACTIVE

## 2020-12-19 IMAGING — CR DG HAND COMPLETE 3+V*L*
1 series · 3 of 3 positions shown · non-contrast
Comparison: None.

CLINICAL DATA: Left hand pain and swelling for 4 days. Possible
spider bite.

EXAM:
LEFT HAND - COMPLETE 3+ VIEW

[Series 1: dg hand complete left · 0.14mm/px · 3 of 3 slices shown]
[im 1/3]
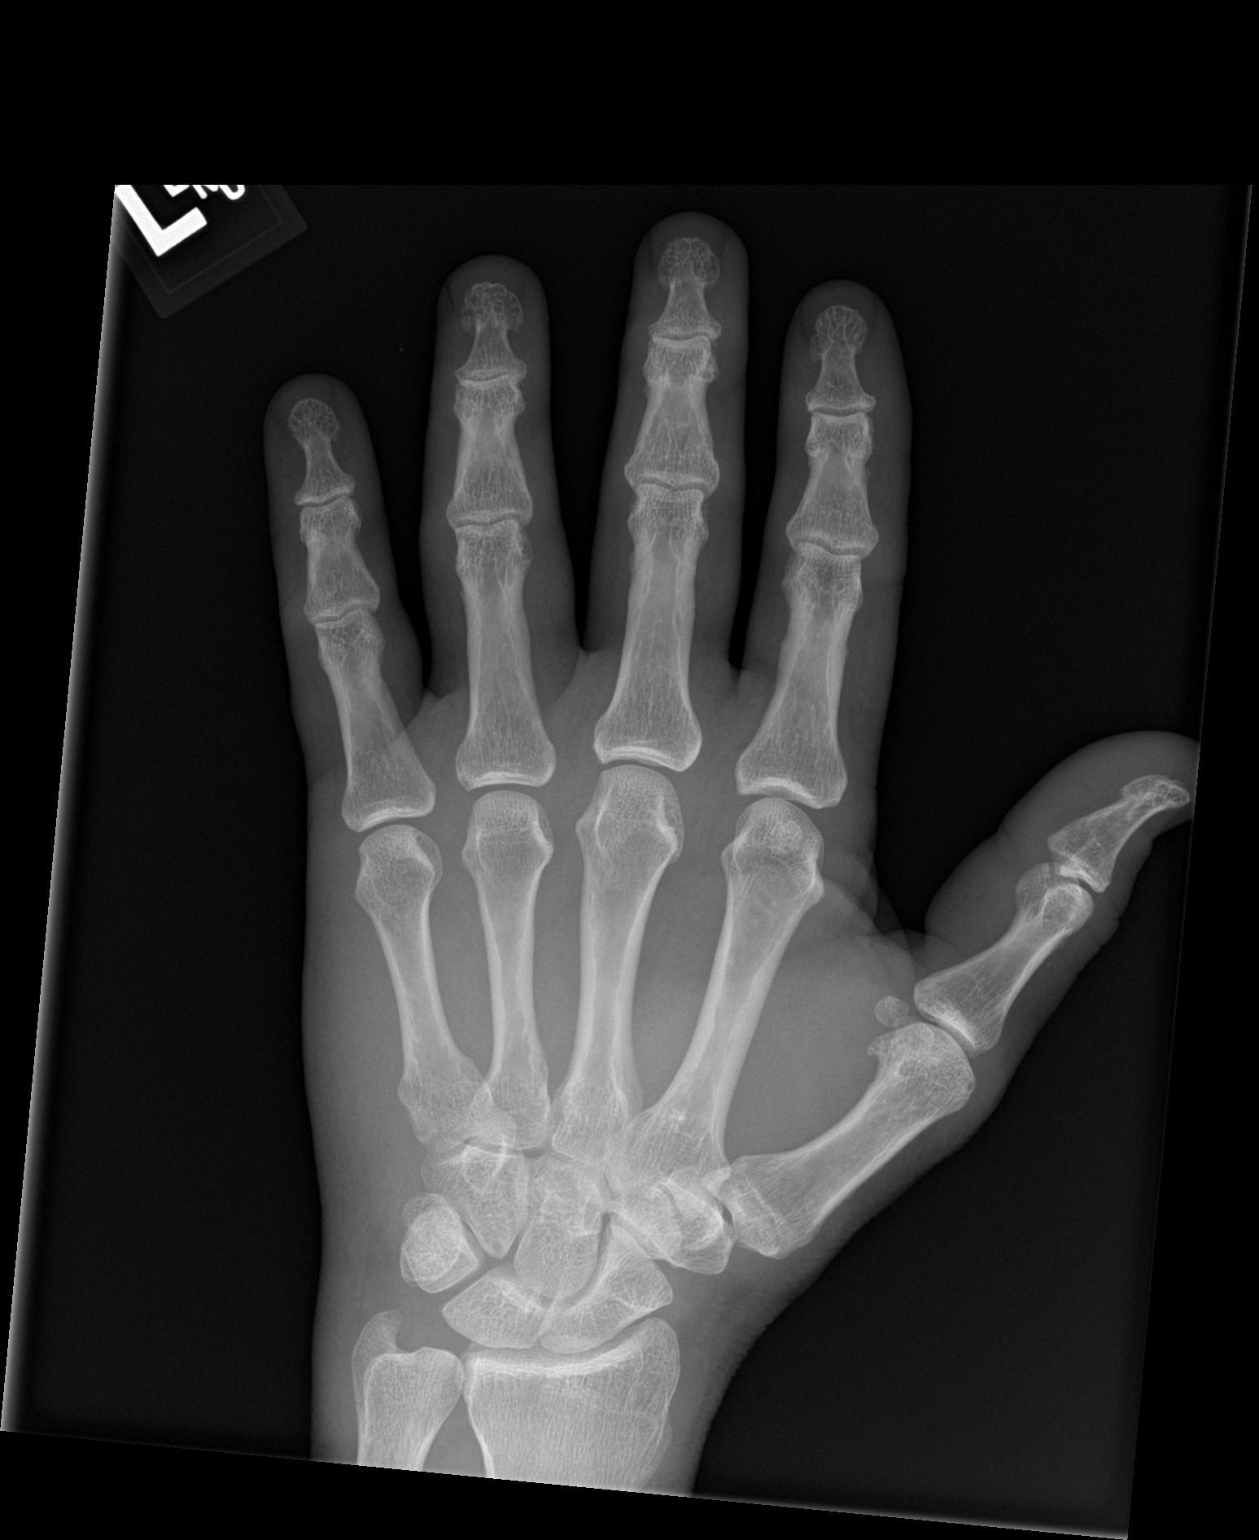
[im 2/3]
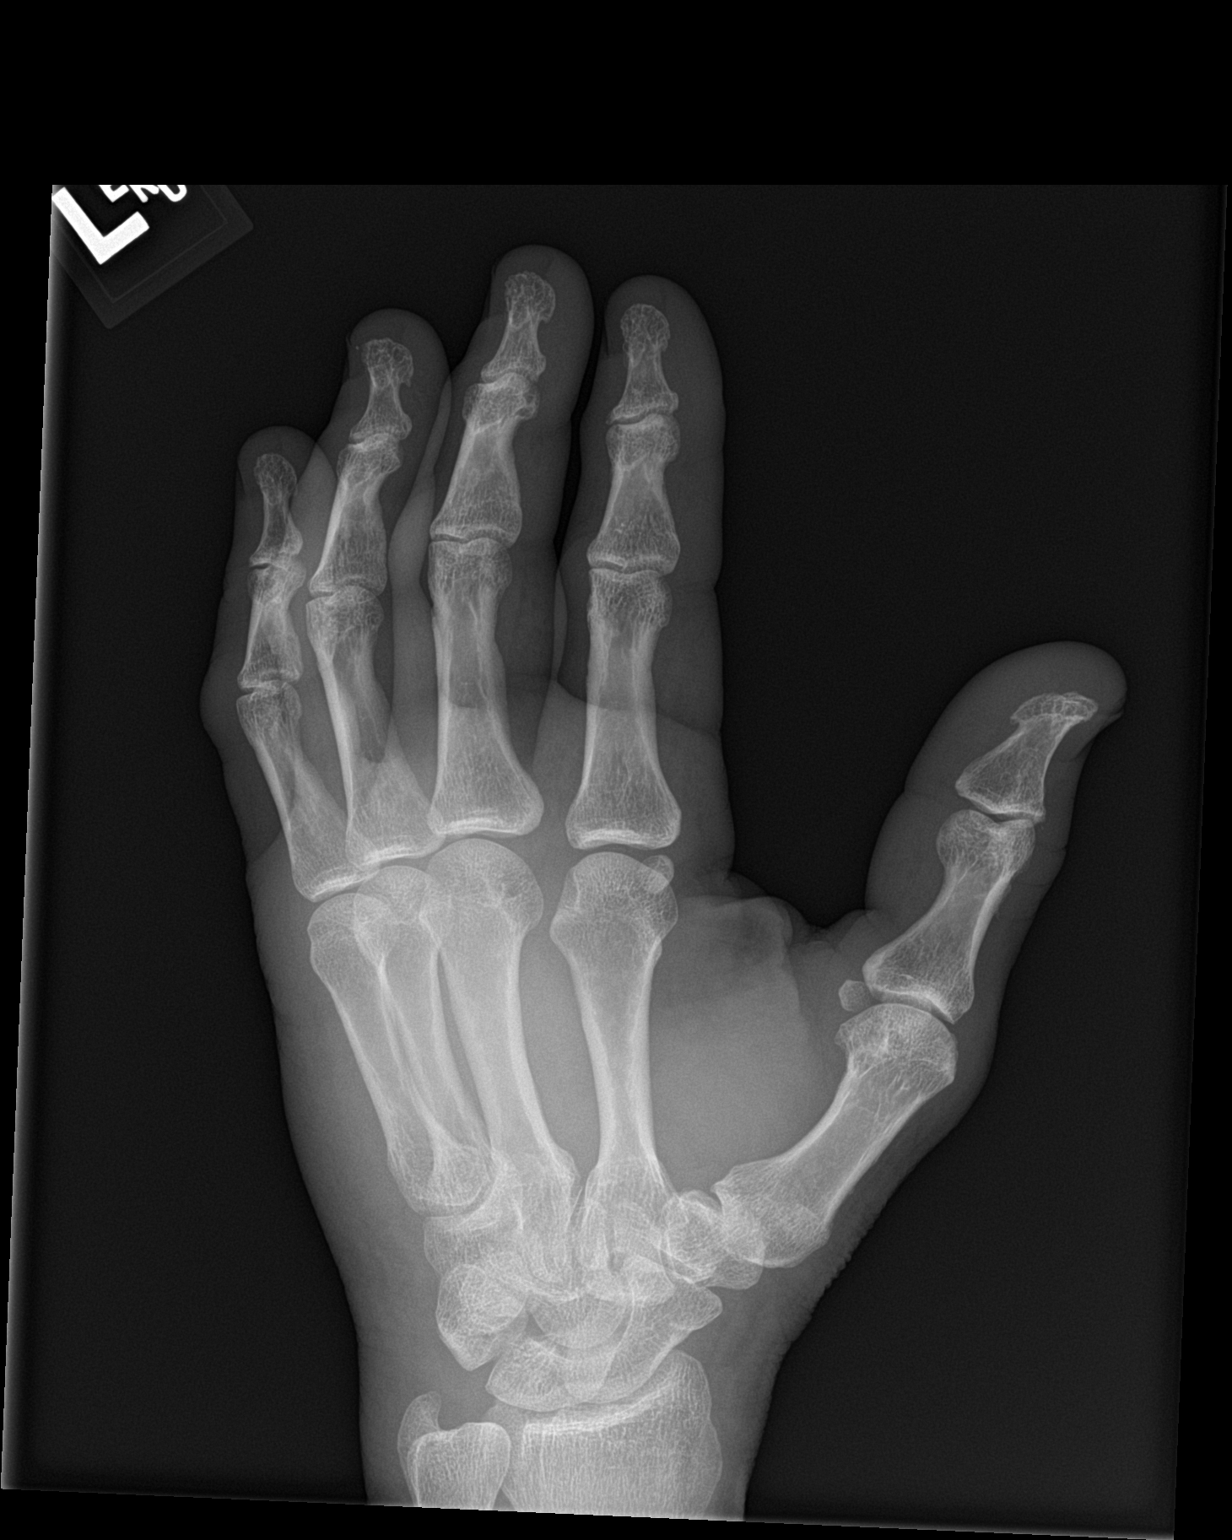
[im 3/3]
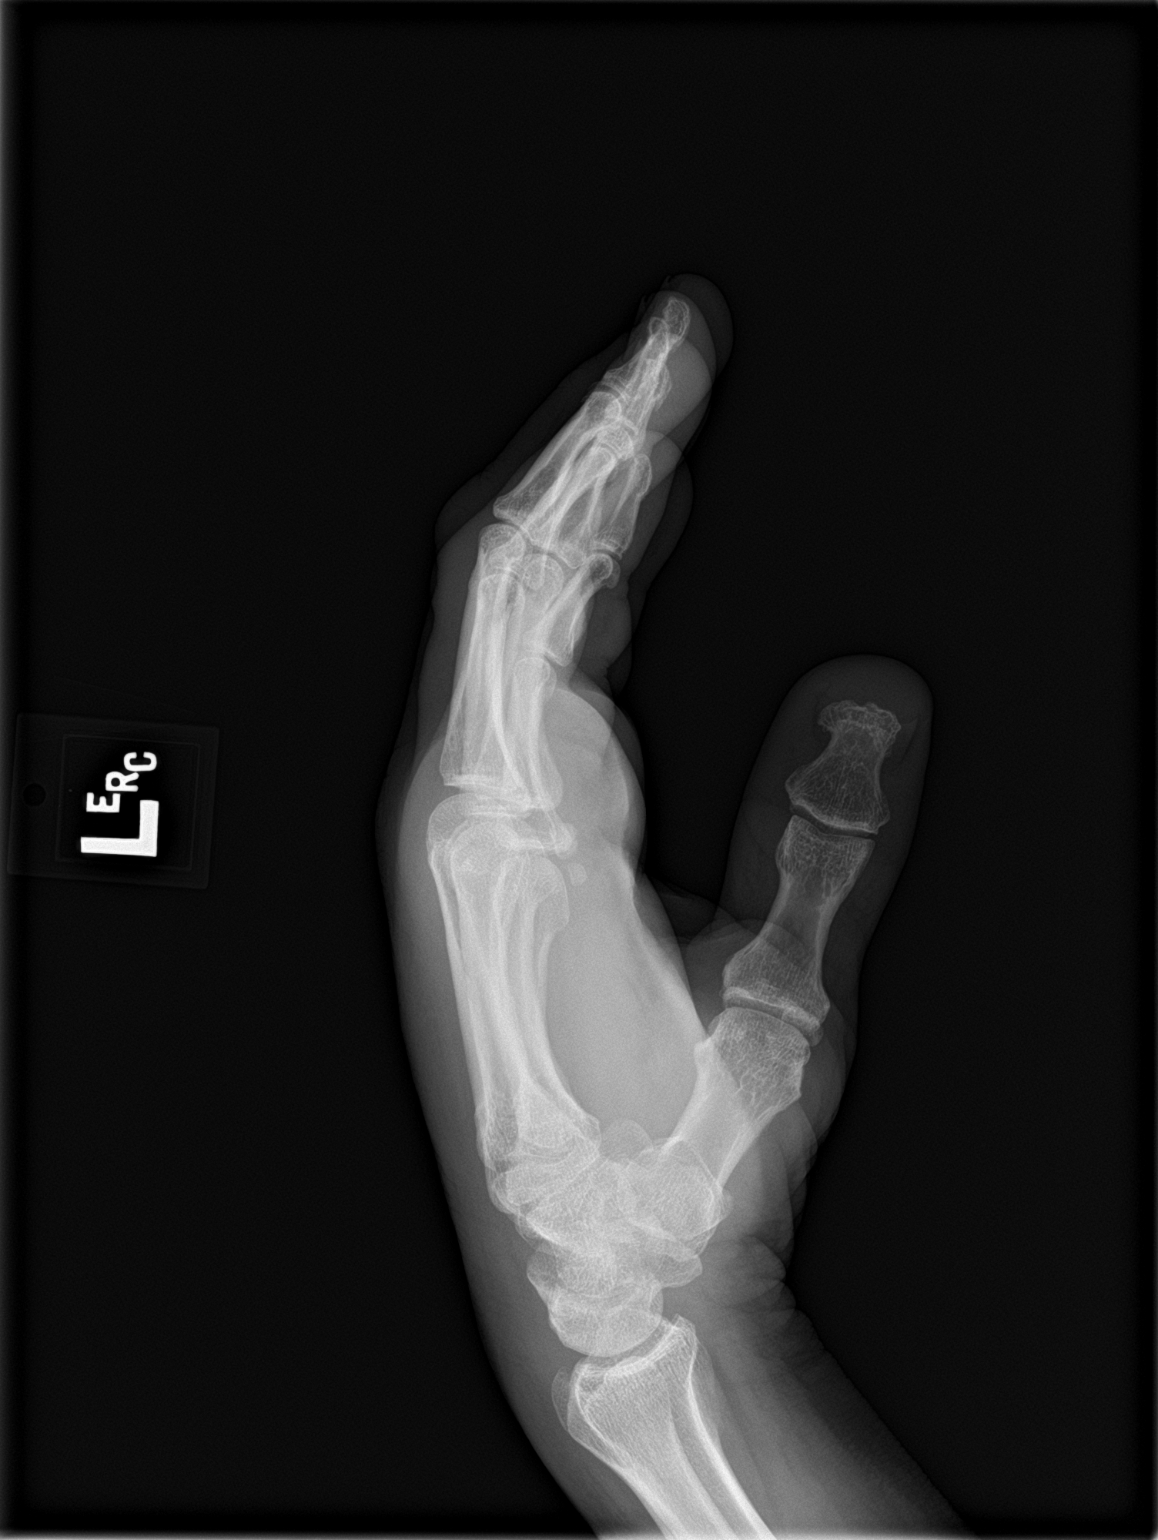

[3 of 3 positions shown; findings below may reference images not displayed]

FINDINGS: Degenerative changes in the interphalangeal joints. No evidence of
acute fracture or dislocation. No focal bone lesion or bone
destruction. Mild soft tissue swelling over the dorsum of the hand.
No radiopaque foreign bodies or soft tissue gas collections.
IMPRESSION: Degenerative changes in the interphalangeal joints. No acute bony
abnormalities. Soft tissue swelling.

## 2020-12-19 MED ORDER — INSULIN ASPART 100 UNIT/ML ~~LOC~~ SOLN: 0.0000 [IU] | Freq: Every day | SUBCUTANEOUS | Status: DC

## 2020-12-19 MED ORDER — ALBUTEROL SULFATE HFA 108 (90 BASE) MCG/ACT IN AERS
2.0000 | INHALATION_SPRAY | RESPIRATORY_TRACT | Status: DC | PRN
  Filled 2020-12-19: qty 6.7

## 2020-12-19 MED ORDER — VANCOMYCIN HCL 1000 MG/200ML IV SOLN
1000.0000 mg | Freq: Two times a day (BID) | INTRAVENOUS | Status: DC
  Administered 2020-12-19 – 2020-12-20 (×2): 1000 mg via INTRAVENOUS
  Filled 2020-12-19 (×3): qty 200

## 2020-12-19 MED ORDER — MORPHINE SULFATE (PF) 2 MG/ML IV SOLN
1.0000 mg | INTRAVENOUS | Status: DC | PRN
  Administered 2020-12-19: 1 mg via INTRAVENOUS
  Filled 2020-12-19: qty 1

## 2020-12-19 MED ORDER — SODIUM CHLORIDE 0.9 % IV SOLN: Freq: Once | INTRAVENOUS | Status: AC

## 2020-12-19 MED ORDER — DM-GUAIFENESIN ER 30-600 MG PO TB12
1.0000 | ORAL_TABLET | Freq: Two times a day (BID) | ORAL | Status: DC | PRN
  Filled 2020-12-19: qty 1

## 2020-12-19 MED ORDER — GABAPENTIN 300 MG PO CAPS
300.0000 mg | ORAL_CAPSULE | Freq: Two times a day (BID) | ORAL | Status: DC
  Administered 2020-12-19 – 2020-12-21 (×5): 300 mg via ORAL
  Filled 2020-12-19 (×5): qty 1

## 2020-12-19 MED ORDER — HEPARIN SODIUM (PORCINE) 5000 UNIT/ML IJ SOLN
5000.0000 [IU] | Freq: Three times a day (TID) | INTRAMUSCULAR | Status: DC
  Administered 2020-12-19: 5000 [IU] via SUBCUTANEOUS
  Filled 2020-12-19 (×3): qty 1

## 2020-12-19 MED ORDER — ONDANSETRON HCL 4 MG/2ML IJ SOLN
4.0000 mg | INTRAMUSCULAR | Status: AC
  Administered 2020-12-19: 4 mg via INTRAVENOUS
  Filled 2020-12-19: qty 2

## 2020-12-19 MED ORDER — TRAMADOL HCL 50 MG PO TABS
50.0000 mg | ORAL_TABLET | Freq: Four times a day (QID) | ORAL | Status: DC | PRN
  Filled 2020-12-19: qty 1

## 2020-12-19 MED ORDER — MORPHINE SULFATE (PF) 4 MG/ML IV SOLN
4.0000 mg | Freq: Once | INTRAVENOUS | Status: AC
  Administered 2020-12-19: 4 mg via INTRAVENOUS
  Filled 2020-12-19: qty 1

## 2020-12-19 MED ORDER — TETANUS-DIPHTH-ACELL PERTUSSIS 5-2.5-18.5 LF-MCG/0.5 IM SUSY
0.5000 mL | PREFILLED_SYRINGE | Freq: Once | INTRAMUSCULAR | Status: DC
  Filled 2020-12-19: qty 0.5

## 2020-12-19 MED ORDER — SODIUM CHLORIDE 0.9 % IV SOLN
2.0000 g | Freq: Three times a day (TID) | INTRAVENOUS | Status: DC
  Administered 2020-12-19 – 2020-12-20 (×3): 2 g via INTRAVENOUS
  Filled 2020-12-19 (×5): qty 2

## 2020-12-19 MED ORDER — ACETAMINOPHEN 325 MG PO TABS: 650.0000 mg | ORAL_TABLET | Freq: Four times a day (QID) | ORAL | Status: DC | PRN

## 2020-12-19 MED ORDER — OXYCODONE-ACETAMINOPHEN 5-325 MG PO TABS
1.0000 | ORAL_TABLET | ORAL | Status: DC | PRN
  Administered 2020-12-19 – 2020-12-21 (×11): 1 via ORAL
  Filled 2020-12-19 (×11): qty 1

## 2020-12-19 MED ORDER — HYDROMORPHONE HCL 1 MG/ML IJ SOLN: 1.0000 mg | INTRAMUSCULAR | Status: DC | PRN

## 2020-12-19 MED ORDER — CYCLOBENZAPRINE HCL 10 MG PO TABS
10.0000 mg | ORAL_TABLET | Freq: Three times a day (TID) | ORAL | Status: DC | PRN
  Administered 2020-12-19 – 2020-12-21 (×4): 10 mg via ORAL
  Filled 2020-12-19 (×4): qty 1

## 2020-12-19 MED ORDER — NICOTINE 21 MG/24HR TD PT24
21.0000 mg | MEDICATED_PATCH | Freq: Every day | TRANSDERMAL | Status: DC
  Administered 2020-12-19 – 2020-12-21 (×3): 21 mg via TRANSDERMAL
  Filled 2020-12-19 (×3): qty 1

## 2020-12-19 MED ORDER — SODIUM CHLORIDE 0.9 % IV SOLN
2.0000 g | INTRAVENOUS | Status: AC
  Administered 2020-12-19: 2 g via INTRAVENOUS
  Filled 2020-12-19: qty 20

## 2020-12-19 MED ORDER — SODIUM CHLORIDE 0.9 % IV SOLN: INTRAVENOUS | Status: DC

## 2020-12-19 MED ORDER — HYDRALAZINE HCL 20 MG/ML IJ SOLN: 5.0000 mg | INTRAMUSCULAR | Status: DC | PRN

## 2020-12-19 MED ORDER — INSULIN ASPART 100 UNIT/ML ~~LOC~~ SOLN
0.0000 [IU] | Freq: Three times a day (TID) | SUBCUTANEOUS | Status: DC
  Filled 2020-12-19: qty 1

## 2020-12-19 MED ORDER — VANCOMYCIN HCL 1750 MG/350ML IV SOLN
1750.0000 mg | Freq: Once | INTRAVENOUS | Status: AC
  Administered 2020-12-19: 1750 mg via INTRAVENOUS
  Filled 2020-12-19: qty 350

## 2020-12-19 NOTE — ED Notes (Signed)
Pt presents to the ED for L hand pain and swelling. Pt states he woke up for 4 days ago and saw a bite mark on his L hand. Pt states he does not know what bit him. But the pain and swelling has gotten worse. On assessment, L hand is swollen. Pt states it is throbbing with pain but denies numbness.

## 2020-12-19 NOTE — ED Notes (Addendum)
This RN to administer tetanus shot. Pt states, "I refuse, I'm tired of being poked on right now. I want to postpone it unless the doctor says I absolutely need it. Maybe they can give it when I get upstairs to a room. I hate needles." Explained the risk of not getting the tetanus shot. Pt still refused.

## 2020-12-19 NOTE — H&P (Signed)
History and Physical    Jersey Ravenscroft Grainger SVX:[id number] DOB: 09-18-1960 DOA: 12/19/2020  Referring MD/NP/PA:   PCP: Patient, No Pcp Per (Inactive)   Patient coming from:  The patient is coming from home.  At baseline, pt is independent for most of ADL.        Chief Complaint: left hand and forearm pain  HPI: Terry Pruitt is a 60 y.o. male with medical history significant of HTN, HLD, DM, COPD, tobacco abuse, who presents with left hand and forearm pain.   Pt states that he had possible spider bite to his left 4th finger 4 days ago. His 4th finger started swelling, becomes red and painful.  The swelling and redness has been progressively worsening to have involved the whole left hand and then spreading up to his forearm. It feels very hot to the touch.  The pain is constant, moderate to severe, sharp, aggravated by movement of her fingers. His palm is not swollen and not too tender.He denies fever and chills.  Patient has some mild dry cough, mild shortness of breath due to COPD, which has not changed.  No chest pain, abdominal pain, nausea, vomiting, diarrhea.  No symptoms of UTI.  He says he has been off his diabetic medication for a long time (about 7 months).      ED Course: pt was found to have WBC 10.7, lactic acid 1.3, negative COVID PCR, GFR> 60, potassium 3.4, temperature normal, blood pressure 122/83, heart rate 79, RR 16, oxygen saturation 99% on room air.  Patient is admitted to [facility] bed as inpatient. Dr. Sharlet Salina of orthopedic surgery is consulted  X-ray of left hand showed: Degenerative changes in the interphalangeal joints. No acute bony abnormalities. Soft tissue swelling.  Review of Systems:   General: no fevers, chills, no body weight gain, has poor appetite, has fatigue HEENT: no blurry vision, hearing changes or sore throat Respiratory: has dyspnea, coughing, no wheezing CV: no chest pain, no palpitations GI: no nausea, vomiting, abdominal pain, diarrhea, constipation GU:  no dysuria, burning on urination, increased urinary frequency, hematuria  Ext: no leg edema Neuro: no unilateral weakness, numbness, or tingling, no vision change or hearing loss Skin: no rash, no skin tear. MSK: has swelling and pain in left hand and forearm Heme: No easy bruising.  Travel history: No recent long distant travel.  Allergy:  Allergies  Allergen Reactions  . Hydrocodone-Acetaminophen Itching    Pt states he can tolerate    Past Medical History:  Diagnosis Date  . COPD (chronic obstructive pulmonary disease) ([place])   . Degenerative arthritis   . Diabetes mellitus without complication ([place])   . [place] cholesterol   . Hypertension   . Sciatic pain     Past Surgical History:  Procedure Laterality Date  . left leg surgery      Social History:  reports that he has been smoking cigarettes. He has been smoking about 1.00 pack per day. He has never used smokeless tobacco. He reports current drug use. Drug: Marijuana. He reports that he does not drink alcohol.  Family History:  Family History  Problem Relation Age of Onset  . COPD Mother   . Heart disease Father   . Diabetes Mellitus II Father      Prior to Admission medications   Medication Sig Start Date End Date Taking? Authorizing Provider  cyclobenzaprine (FLEXERIL) 10 MG tablet Take 10 mg by mouth 3 (three) times daily as needed for muscle spasms.   Yes  [provider]  gabapentin (NEURONTIN) 300 MG capsule Take 300 mg by mouth 2 (two) times daily.   Yes [provider]  glimepiride (AMARYL) 1 MG tablet Take 1 mg by mouth every morning. 07/29/20  Yes [provider]  metFORMIN (GLUCOPHAGE) 1000 MG tablet Take 1 tablet by mouth 2 (two) times daily.   Yes [provider]  predniSONE (STERAPRED UNI-PAK 21 TAB) 10 MG (21) TBPK tablet Take by mouth daily. 6,5,4,3,2,1 Patient not taking: No sig reported 12/03/20   Lannie Fields, [place]    Physical Exam: Vitals:   12/19/20 0618  12/19/20 0739 12/19/20 1110 12/19/20 1523  BP: 119/84 122/83 121/77 106/67  Pulse: 76 79 74 94  Resp: '15 16 16 16  ' Temp: 98 F (36.7 C) 98.7 F (37.1 C) 98.1 F (36.7 C) 98.1 F (36.7 C)  TempSrc:      SpO2: 100% 99% 98% 96%  Weight:      Height:       General: Not in acute distress HEENT:       Eyes: PERRL, EOMI, no scleral icterus.       ENT: No discharge from the ears and nose, no pharynx injection, no tonsillar enlargement.        Neck: No JVD, no bruit, no mass felt. Heme: No neck lymph node enlargement. Cardiac: S1/S2, RRR, No murmurs, No gallops or rubs. Respiratory: No rales, wheezing, rhonchi or rubs. GI: Soft, nondistended, nontender, no rebound pain, no organomegaly, BS present. GU: No hematuria Ext: No pitting leg edema bilaterally. 1+DP/PT pulse bilaterally.  Has a tenderness, warmth, erythema and swelling in left dorsal hand and forearm     Musculoskeletal: No joint deformities, No joint redness or warmth, no limitation of ROM in spin. Skin: No rashes.  Neuro: Alert, oriented X3, cranial nerves II-XII grossly intact, moves all extremities normally.  Psych: Patient is not psychotic, no suicidal or hemocidal ideation.  Labs on Admission: I have personally reviewed following labs and imaging studies  CBC: Recent Labs  Lab 12/19/20 0025  WBC 10.7*  NEUTROABS 7.9*  HGB 13.8  HCT 40.7  MCV 95.1  PLT 449   Basic Metabolic Panel: Recent Labs  Lab 12/19/20 0025  NA 137  K 3.4*  CL 103  CO2 25  GLUCOSE 196*  BUN 20  CREATININE 1.12  CALCIUM 9.0   GFR: Estimated Creatinine Clearance: 73.8 mL/min (by C-G formula based on SCr of 1.12 mg/dL). Liver Function Tests: Recent Labs  Lab 12/19/20 0025  AST 14*  ALT 16  ALKPHOS 73  BILITOT 0.6  PROT 6.7  ALBUMIN 3.6   No results for input(s): LIPASE, AMYLASE in the last 168 hours. No results for input(s): AMMONIA in the last 168 hours. Coagulation Profile: Recent Labs  Lab 12/19/20 1039  INR 1.0    Cardiac Enzymes: No results for input(s): CKTOTAL, CKMB, CKMBINDEX, TROPONINI in the last 168 hours. BNP (last 3 results) No results for input(s): PROBNP in the last 8760 hours. HbA1C: Recent Labs    12/19/20 0957  HGBA1C 7.3*   CBG: Recent Labs  Lab 12/19/20 0034  GLUCAP 189*   Lipid Profile: No results for input(s): CHOL, HDL, LDLCALC, TRIG, CHOLHDL, LDLDIRECT in the last 72 hours. Thyroid Function Tests: No results for input(s): TSH, T4TOTAL, FREET4, T3FREE, THYROIDAB in the last 72 hours. Anemia Panel: No results for input(s): VITAMINB12, FOLATE, FERRITIN, TIBC, IRON, RETICCTPCT in the last 72 hours. Urine analysis:    Component Value Date/Time  COLORURINE YELLOW (A) 04/03/2017 1836   APPEARANCEUR CLEAR (A) 04/03/2017 1836   APPEARANCEUR Clear 07/03/2012 2213   LABSPEC 1.020 04/03/2017 1836   LABSPEC 1.025 07/03/2012 2213   PHURINE 6.0 04/03/2017 1836   GLUCOSEU NEGATIVE 04/03/2017 1836   GLUCOSEU >=500 07/03/2012 2213   HGBUR LARGE (A) 04/03/2017 1836   BILIRUBINUR NEGATIVE 04/03/2017 1836   BILIRUBINUR Negative 07/03/2012 [place] 04/03/2017 1836   PROTEINUR 30 (A) 04/03/2017 1836   NITRITE NEGATIVE 04/03/2017 1836   LEUKOCYTESUR NEGATIVE 04/03/2017 1836   LEUKOCYTESUR Negative 07/03/2012 2213   Sepsis Labs: '@LABRCNTIP' (procalcitonin:4,lacticidven:4) ) Recent Results (from the past 240 hour(s))  Blood culture (routine x 2)     Status: None (Preliminary result)   Collection Time: 12/19/20 12:25 AM   Specimen: BLOOD  Result Value Ref Range Status   Specimen Description BLOOD RIGHT ANTECUBITAL  Final   Special Requests   Final    BOTTLES DRAWN AEROBIC AND ANAEROBIC Blood Culture adequate volume   Culture   Final    NO GROWTH < 12 HOURS Performed at [facility], [address], [place], [place] [address]    Report Status PENDING  Incomplete  Resp Panel by RT-PCR (Flu A&B, Covid) Nasopharyngeal Swab     Status: None    Collection Time: 12/19/20  4:20 AM   Specimen: Nasopharyngeal Swab; Nasopharyngeal(NP) swabs in vial transport medium  Result Value Ref Range Status   SARS Coronavirus 2 by RT PCR NEGATIVE NEGATIVE Final    Comment: (NOTE) SARS-CoV-2 target nucleic acids are NOT DETECTED.  The SARS-CoV-2 RNA is generally detectable in upper respiratory specimens during the acute phase of infection. The lowest concentration of SARS-CoV-2 viral copies this assay can detect is 138 copies/mL. A negative result does not preclude SARS-Cov-2 infection and should not be used as the sole basis for treatment or other patient management decisions. A negative result may occur with  improper specimen collection/handling, submission of specimen other than nasopharyngeal swab, presence of viral mutation(s) within the areas targeted by this assay, and inadequate number of viral copies(<138 copies/mL). A negative result must be combined with clinical observations, patient history, and epidemiological information. The expected result is Negative.  Fact Sheet for Patients:  EntrepreneurPulse.com.au  Fact Sheet for Healthcare Providers:  IncredibleEmployment.be  This test is no t yet approved or cleared by the [place] FDA and  has been authorized for detection and/or diagnosis of SARS-CoV-2 by FDA under an Emergency Use Authorization (EUA). This EUA will remain  in effect (meaning this test can be used) for the duration of the COVID-19 declaration under Section 564(b)(1) of the Act, 21 U.S.C.section 360bbb-3(b)(1), unless the authorization is terminated  or revoked sooner.       Influenza A by PCR NEGATIVE NEGATIVE Final   Influenza B by PCR NEGATIVE NEGATIVE Final    Comment: (NOTE) The Xpert Xpress SARS-CoV-2/FLU/RSV plus assay is intended as an aid in the diagnosis of influenza from Nasopharyngeal swab specimens and should not be used as a sole basis for treatment.  Nasal washings and aspirates are unacceptable for Xpert Xpress SARS-CoV-2/FLU/RSV testing.  Fact Sheet for Patients: EntrepreneurPulse.com.au  Fact Sheet for Healthcare Providers: IncredibleEmployment.be  This test is not yet approved or cleared by the [place] FDA and has been authorized for detection and/or diagnosis of SARS-CoV-2 by FDA under an Emergency Use Authorization (EUA). This EUA will remain in effect (meaning this test can be used) for the duration of the COVID-19 declaration under  Section 564(b)(1) of the Act, 21 U.S.C. section 360bbb-3(b)(1), unless the authorization is terminated or revoked.  Performed at [facility], [address] [place], [place] [address]      Radiological Exams on Admission: DG Hand Complete Left  Result Date: 12/19/2020 CLINICAL DATA:  Left hand pain and swelling for 4 days. Possible spider bite. EXAM: LEFT HAND - COMPLETE 3+ VIEW COMPARISON:  None. FINDINGS: Degenerative changes in the interphalangeal joints. No evidence of acute fracture or dislocation. No focal bone lesion or bone destruction. Mild soft tissue swelling over the dorsum of the hand. No radiopaque foreign bodies or soft tissue gas collections. IMPRESSION: Degenerative changes in the interphalangeal joints. No acute bony abnormalities. Soft tissue swelling. Electronically Signed   By: Lucienne Capers M.D.   On: 12/19/2020 00:39     EKG:   Not done in ED, will get one.   Assessment/Plan Principal Problem:   Cellulitis of left upper extremity Active Problems:   HTN (hypertension)   Diabetes mellitus without complication (HCC)   COPD (chronic obstructive pulmonary disease) (HCC)   Tobacco abuse   Cellulitis of left upper extremity: Patient does not have sepsis.  I consulted Dr. Sharlet Salina of orthopedic surgeon.  He looked at the picture, does not think that patient needs urgent I&D.  He recommended to continue  antibiotics.  -Admit to MedSurg bed as inpatient - Vancomycin and cefepime (patient received 1 dose of Rocephin) -Follow-up of blood culture and urine culture -Pain control: As needed Dilaudid, Percocet, Tylenol -Check ESR and CRP  HTN (hypertension): Patient is not taking medications currently.  Blood pressure 122/83 -IV hydralazine as needed  Diabetes mellitus without complication ([place]): B7S 6.6 recently. well controlled. Patient is not taking medications. -Sliding scale insulin   COPD (chronic obstructive pulmonary disease) ([place]): Stable -As needed albuterol  Tobacco abuse -Nicotine patch     DVT ppx: SQ Heparin     Code Status: Full code Family Communication: not done, no family member is at bed side.   Disposition Plan:  Anticipate discharge back to previous environment Consults called:  Dr. Sharlet Salina of orthopedic surgery is consulted Admission status and Level of care: Med-Surg:    as inpt         Status is: Inpatient  Remains inpatient appropriate because:Inpatient level of care appropriate due to severity of illness   Dispo: The patient is from: Home              Anticipated d/c is to: Home              Patient currently is not medically stable to d/c.   Difficult to place patient No          Date of Service 12/19/2020    [facility]   If 7PM-7AM, please contact night-coverage [email or web address] 12/19/2020, 8:13 PM

## 2020-12-19 NOTE — ED Triage Notes (Signed)
Pt arrived via POV with reports of L hand swelling x 4 days, pt states he thinks he had a spider bite to the L ring finger, states he popped it and since then he started having swelling and redness throughout entire hand traveling up left forearm.  Pt is also a diabetic, has been off meds for 7 months, states he does not have a PCP and recently qualified for Medicaid.

## 2020-12-19 NOTE — ED Provider Notes (Signed)
[facility] Emergency Department Provider Note  ____________________________________________   Event Date/Time   First MD Initiated Contact with Patient 12/19/20 0258     (approximate)  I have reviewed the triage vital signs and the nursing notes.   HISTORY  Chief Complaint Hand Pain and Hand Swelling    HPI Terry Pruitt is a 60 y.o. male with medical history as listed below which notably includes diabetes which is not currently controlled medication.  He presents tonight for rapid worsening of pain and swelling and redness in his left hand.  He said that it started about 4 days ago with a "pimple" on the top/back of his left fourth finger.  He tried to pop it and said it hurt very badly and he did not get too much out.  It did not change very much for the next 2 days but then as of yesterday his finger and the adjacent fingers, then his hand started swelling and becoming more red and painful.  He now has red streaks going all the way up his arm past his elbow.  It feels very hot to the touch.  He has pain with movement of any and all of his fingers.  He is able to flex and extend the fingers but it hurts to do so.  The greatest pain is on the left fourth finger.  The palm is not swollen and not too tender.   He denies fever and chills, chest pain, shortness of breath, nausea, vomiting, and abdominal pain.  He says he has been off his diabetic medication for a long time (about 7 months).  Nothing in particular makes his symptoms better.        Past Medical History:  Diagnosis Date  . COPD (chronic obstructive pulmonary disease) (HCC)   . Degenerative arthritis   . Diabetes mellitus without complication (HCC)   . [place] cholesterol   . Hypertension   . Sciatic pain     Patient Active Problem List   Diagnosis Date Noted  . Cellulitis of left arm 12/19/2020  . HTN (hypertension) 12/19/2020  . DM (diabetes mellitus) (HCC) 12/19/2020  . COPD (chronic  obstructive pulmonary disease) (HCC) 12/19/2020    History reviewed. No pertinent surgical history.  Prior to Admission medications   Medication Sig Start Date End Date Taking? Authorizing Provider  cyclobenzaprine (FLEXERIL) 10 MG tablet Take 10 mg by mouth 3 (three) times daily as needed for muscle spasms.   Yes [provider]  gabapentin (NEURONTIN) 300 MG capsule Take 300 mg by mouth 2 (two) times daily.   Yes [provider]  glimepiride (AMARYL) 1 MG tablet Take 1 mg by mouth every morning. 07/29/20  Yes [provider]  metFORMIN (GLUCOPHAGE) 1000 MG tablet Take 1 tablet by mouth 2 (two) times daily.   Yes [provider]  predniSONE (STERAPRED UNI-PAK 21 TAB) 10 MG (21) TBPK tablet Take by mouth daily. 6,5,4,3,2,1 Patient not taking: No sig reported 12/03/20   Orvil Feil, PA-C    Allergies Hydrocodone-acetaminophen  History reviewed. No pertinent family history.  Social History Social History   Tobacco Use  . Smoking status: Current Every Day Smoker    Packs/day: 1.00    Types: Cigarettes  . Smokeless tobacco: Never Used  Substance Use Topics  . Alcohol use: No  . Drug use: Yes    Types: Marijuana    Review of Systems Constitutional: No fever/chills Eyes: No visual changes. ENT: No sore throat.  Cardiovascular: Denies chest pain. Respiratory: Denies shortness of breath. Gastrointestinal: No abdominal pain.  No nausea, no vomiting.  No diarrhea.  No constipation. Genitourinary: Negative for dysuria. Musculoskeletal: Pain and swelling and redness in left hand and rating up the left arm. Integumentary: Redness and swelling of the left hand and arm. Neurological: Negative for headaches, focal weakness or numbness.   ____________________________________________   PHYSICAL EXAM:  VITAL SIGNS: ED Triage Vitals  Enc Vitals Group     BP 12/19/20 0005 (!) 150/93     Pulse Rate 12/19/20 0005 93     Resp 12/19/20 0005 20      Temp 12/19/20 0005 98.6 F (37 C)     Temp Source 12/19/20 0005 Oral     SpO2 12/19/20 0005 98 %     Weight 12/19/20 0006 72.6 kg (160 lb)     Height 12/19/20 0006 1.829 m (6')     Head Circumference --      Peak Flow --      Pain Score 12/19/20 0006 8     Pain Loc --      Pain Edu? --      Excl. in GC? --     Constitutional: Alert and oriented.  Eyes: Conjunctivae are normal.  Head: Atraumatic. Nose: No congestion/rhinnorhea. Mouth/Throat: Patient is wearing a mask. Neck: No stridor.  No meningeal signs.   Cardiovascular: Normal rate, regular rhythm. Good peripheral circulation. Respiratory: Normal respiratory effort.  No retractions. Gastrointestinal: Soft and nontender. No distention.  Musculoskeletal: Erythema and edema of the left hand and all 4 of the fingers with less involvement of the thumb, primarily on the dorsum of the hand, with red streaks consistent with cellulitis radiating up the arm past the elbow and including more than 50% of the left upper extremity.  The focus of the infection seems to be the dorsum of the fourth finger of the left hand although there is no palpable induration or drainable fluid collection at this time.  The patient has some pain with passive extension of all of his fingers but no fusiform swelling and he is not holding his fingers in slight flexion.  The tenderness is primarily along the dorsum of the hand, not the palmar flexor tendon sheaths. Neurologic:  Normal speech and language. No gross focal neurologic deficits are appreciated.  Skin:  Skin is warm, dry and intact. Psychiatric: Mood and affect are normal. Speech and behavior are normal.  ____________________________________________   LABS (all labs ordered are listed, but only abnormal results are displayed)  Labs Reviewed  COMPREHENSIVE METABOLIC PANEL - Abnormal; Notable for the following components:      Result Value   Potassium 3.4 (*)    Glucose, Bld 196 (*)    AST 14 (*)     All other components within normal limits  CBC WITH DIFFERENTIAL/PLATELET - Abnormal; Notable for the following components:   WBC 10.7 (*)    Neutro Abs 7.9 (*)    All other components within normal limits  CBG MONITORING, ED - Abnormal; Notable for the following components:   Glucose-Capillary 189 (*)    All other components within normal limits  CULTURE, BLOOD (ROUTINE X 2)  RESP PANEL BY RT-PCR (FLU A&B, COVID) ARPGX2  CULTURE, BLOOD (ROUTINE X 2)  LACTIC ACID, PLASMA   ____________________________________________  EKG  No indication for emergent EKG ____________________________________________  RADIOLOGY Marylou Mccoy, personally viewed and evaluated these images (plain radiographs) as part of my medical decision making, as  well as reviewing the written report by the radiologist.  ED MD interpretation: No acute abnormalities other than soft tissue swelling identified on hand x-rays  Official radiology report(s): DG Hand Complete Left  Result Date: 12/19/2020 CLINICAL DATA:  Left hand pain and swelling for 4 days. Possible spider bite. EXAM: LEFT HAND - COMPLETE 3+ VIEW COMPARISON:  None. FINDINGS: Degenerative changes in the interphalangeal joints. No evidence of acute fracture or dislocation. No focal bone lesion or bone destruction. Mild soft tissue swelling over the dorsum of the hand. No radiopaque foreign bodies or soft tissue gas collections. IMPRESSION: Degenerative changes in the interphalangeal joints. No acute bony abnormalities. Soft tissue swelling. Electronically Signed   By: Burman NievesWilliam  Stevens M.D.   On: 12/19/2020 00:39    ____________________________________________   PROCEDURES   Procedure(s) performed (including Critical Care):  Procedures   ____________________________________________   INITIAL IMPRESSION / MDM / ASSESSMENT AND PLAN / ED COURSE  As part of my medical decision making, I reviewed the following data within the electronic medical  record:  Nursing notes reviewed and incorporated, Labs reviewed , Old chart reviewed, Radiograph reviewed  and Notes from prior ED visits   Differential diagnosis includes, but is not limited to, cellulitis, abscess, infectious tenosynovitis, bacteremia.  Patient does not appear to have any drainable fluid collection.  His vital signs are stable and he is lab work is within normal limits other than some hyperglycemia.  He has very mild leukocytosis of 10.7 and normal lactic acid.  However he has very clearly defined streaks of cellulitis extending from his edematous hand all the way up proximal to the elbow.  This has rapidly worsened over the last 24+ hours.  He needs empiric IV antibiotics and I ordered ceftriaxone 2 g IV and ceftriaxone per pharmacy consult.  Infectious tenosynovitis is possible but I think is unlikely based on the location and current physical exam although developing a deep hand infection or arm infection is certainly of concern.  No indication for emergent surgical intervention at this time.  I will consult the hospitalist for admission and for inpatient orthopedics consult.  I also ordered a Tdap booster and morphine 4 mg IV and Zofran 4 mg IV for pain and nausea control.  I ordered 100 mL normal saline per hour infusion and I asked the patient to remain n.p.o. in case his infection worsens and the surgeon wants to take him to the operating room.       Clinical Course as of 12/19/20 [id number]  Wynelle LinkSun Dec 19, 2020  [id number] Discussed case by secure chat text messages with Dr. Para Marchuncan and she will admit. [CF]    Clinical Course User Index [CF] Loleta RoseForbach, Ameya Kutz, MD     ____________________________________________  FINAL CLINICAL IMPRESSION(S) / ED DIAGNOSES  Final diagnoses:  Cellulitis of hand, left  Left arm cellulitis     MEDICATIONS GIVEN DURING THIS VISIT:  Medications  vancomycin (VANCOREADY) IVPB 1750 mg/350 mL (1,750 mg Intravenous New Bag/Given 12/19/20 0552)  Tdap  (BOOSTRIX) injection 0.5 mL (0 mLs Intramuscular Hold 12/19/20 0428)  traMADol (ULTRAM) tablet 50 mg (has no administration in time range)  morphine 2 MG/ML injection 1 mg (has no administration in time range)  cefTRIAXone (ROCEPHIN) 2 g in sodium chloride 0.9 % 100 mL IVPB (0 g Intravenous Stopped 12/19/20 0521)  morphine 4 MG/ML injection 4 mg (4 mg Intravenous Given 12/19/20 0413)  ondansetron (ZOFRAN) injection 4 mg (4 mg Intravenous Given 12/19/20 0413)  0.9 %  sodium  chloride infusion ( Intravenous Transfusing/Transfer 12/19/20 0521)     ED Discharge Orders    None      *Please note:  Gorman Safi Tally was evaluated in Emergency Department on 12/19/2020 for the symptoms described in the history of present illness. He was evaluated in the context of the global COVID-19 pandemic, which necessitated consideration that the patient might be at risk for infection with the SARS-CoV-2 virus that causes COVID-19. Institutional protocols and algorithms that pertain to the evaluation of patients at risk for COVID-19 are in a state of rapid change based on information released by regulatory bodies including the CDC and federal and state organizations. These policies and algorithms were followed during the patient's care in the ED.  Some ED evaluations and interventions may be delayed as a result of limited staffing during and after the pandemic.*  Note:  This document was prepared using Dragon voice recognition software and may include unintentional dictation errors.   Loleta Rose, MD 12/19/20 [id number]

## 2020-12-19 NOTE — Progress Notes (Signed)
Pharmacy Antibiotic Note  Terry Pruitt is a 61 y.o. male with PMH for COPD, arthritis, [place] cholesterol, DM, and HTN admitted on 12/19/2020 with cellulitis. Pt had rapid worsening pain, swelling, and redness in his left hand. Pt stated it started about 4 days ago with a "pimple" on the top/back of his left fourth finger. He tried to pop it and said it hurt very badly and he did not get too much out.  It did not change very much for the next 2 days but then as of yesterday his finger and the adjacent fingers, then his hand started swelling and becoming more red and painful.  He now has red streaks going all the way up his arm past his elbow.  It feels very hot to the touch. Pt received vancomycin 1750mg  x1 loading dose and a one time dose of ceftriaxone. Pharmacy has been consulted for cefepime and vancomycin dosing.  Plan: Start Cefepime 2g IV q8h Vancomycin 1000mg  IV q12h  Est AUC 547  Est Cpk 33  Est Ctr 15  Monitor renal function and adjust dose as clinically indicated  Obtain vancomycin levels around 4th or 5th dose Follow up cultures and de-escalate abx as appropriate   Height: 6' (182.9 cm) Weight: 72.6 kg (160 lb) IBW/kg (Calculated) : 77.6  Temp (24hrs), Avg:98.4 F (36.9 C), Min:98 F (36.7 C), Max:98.7 F (37.1 C)  Recent Labs  Lab 12/19/20 0025  WBC 10.7*  CREATININE 1.12  LATICACIDVEN 1.3    Estimated Creatinine Clearance: 73.8 mL/min (by C-G formula based on SCr of 1.12 mg/dL).    Allergies  Allergen Reactions  . Hydrocodone-Acetaminophen Itching    Pt states he can tolerate    Antimicrobials this admission: 4/24 Ceftriaxone x1 4/24 Cefepime >> 4/24 Vancomycin >>  Dose adjustments this admission:   Microbiology results: 4/24 BCx: sent  Thank you for allowing pharmacy to be a part of this patient's care.  5/24, PharmD Pharmacy Resident  12/19/2020 10:22 AM

## 2020-12-19 NOTE — Progress Notes (Signed)
PHARMACY -  BRIEF ANTIBIOTIC NOTE   Pharmacy has received consult(s) for Vancomycin for cellulitis of left hand radiating proximally past elbow, concerned for MRSA from an ED provider.  The patient's profile has been reviewed for ht/wt/allergies/indication/available labs.    One time order(s) placed for Vancomycin 1750mg  per pt wt of 72.6 kg.  Further antibiotics/pharmacy consults should be ordered by admitting physician if indicated.                       , PharmD, [facility] 12/19/2020 3:50 AM

## 2020-12-20 LAB — BASIC METABOLIC PANEL
Anion gap: 7 (ref 5–15)
BUN: 20 mg/dL (ref 6–20)
CO2: 25 mmol/L (ref 22–32)
Calcium: 8.8 mg/dL — ABNORMAL LOW (ref 8.9–10.3)
Chloride: 102 mmol/L (ref 98–111)
Creatinine, Ser: 0.79 mg/dL (ref 0.61–1.24)
GFR, Estimated: >60 mL/min (ref >60)
Glucose, Bld: 205 mg/dL — ABNORMAL HIGH (ref 70–99)
Potassium: 4.2 mmol/L (ref 3.5–5.1)
Sodium: 134 mmol/L — ABNORMAL LOW (ref 135–145)

## 2020-12-20 LAB — GLUCOSE, CAPILLARY
Glucose-Capillary: 147 mg/dL — ABNORMAL HIGH (ref 70–99)
Glucose-Capillary: 195 mg/dL — ABNORMAL HIGH (ref 70–99)
Glucose-Capillary: 107 mg/dL — ABNORMAL HIGH (ref 70–99)
Glucose-Capillary: 116 mg/dL — ABNORMAL HIGH (ref 70–99)

## 2020-12-20 LAB — CBC
HCT: 40.5 % (ref 39.0–52.0)
Hemoglobin: 13.6 g/dL (ref 13.0–17.0)
MCH: 32.2 pg (ref 26.0–34.0)
MCHC: 33.6 g/dL (ref 30.0–36.0)
MCV: 95.7 fL (ref 80.0–100.0)
Platelets: 218 10*3/uL (ref 150–400)
RBC: 4.23 MIL/uL (ref 4.22–5.81)
RDW: 12.5 % (ref 11.5–15.5)
WBC: 8.4 10*3/uL (ref 4.0–10.5)
nRBC: 0 % (ref 0.0–0.2)

## 2020-12-20 MED ORDER — ENSURE ENLIVE PO LIQD
237.0000 mL | Freq: Two times a day (BID) | ORAL | Status: DC
  Administered 2020-12-21: 237 mL via ORAL

## 2020-12-20 MED ORDER — GLIMEPIRIDE 1 MG PO TABS
1.0000 mg | ORAL_TABLET | Freq: Every morning | ORAL | Status: DC
  Administered 2020-12-20: 1 mg via ORAL
  Filled 2020-12-20: qty 1

## 2020-12-20 MED ORDER — HYDROMORPHONE HCL 1 MG/ML IJ SOLN: 1.0000 mg | Freq: Four times a day (QID) | INTRAMUSCULAR | Status: DC | PRN

## 2020-12-20 MED ORDER — ADULT MULTIVITAMIN W/MINERALS CH
1.0000 | ORAL_TABLET | Freq: Every day | ORAL | Status: DC
  Administered 2020-12-20 – 2020-12-21 (×2): 1 via ORAL
  Filled 2020-12-20 (×2): qty 1

## 2020-12-20 MED ORDER — IBUPROFEN 400 MG PO TABS
400.0000 mg | ORAL_TABLET | Freq: Three times a day (TID) | ORAL | Status: DC
  Administered 2020-12-20 – 2020-12-21 (×5): 400 mg via ORAL
  Filled 2020-12-20 (×5): qty 1

## 2020-12-20 MED ORDER — TETANUS-DIPHTH-ACELL PERTUSSIS 5-2.5-18.5 LF-MCG/0.5 IM SUSY
0.5000 mL | PREFILLED_SYRINGE | Freq: Once | INTRAMUSCULAR | Status: AC
  Administered 2020-12-20: 0.5 mL via INTRAMUSCULAR
  Filled 2020-12-20: qty 0.5

## 2020-12-20 MED ORDER — SODIUM CHLORIDE 0.9 % IV SOLN
1.0000 g | Freq: Three times a day (TID) | INTRAVENOUS | Status: DC
  Administered 2020-12-20 – 2020-12-21 (×3): 1 g via INTRAVENOUS
  Filled 2020-12-20 (×5): qty 10

## 2020-12-20 MED ORDER — METFORMIN HCL 500 MG PO TABS
1000.0000 mg | ORAL_TABLET | Freq: Two times a day (BID) | ORAL | Status: DC
  Administered 2020-12-20 – 2020-12-21 (×4): 1000 mg via ORAL
  Filled 2020-12-20 (×4): qty 2

## 2020-12-20 MED ORDER — HYDROMORPHONE HCL 1 MG/ML IJ SOLN: 1.0000 mg | INTRAMUSCULAR | Status: DC | PRN

## 2020-12-20 NOTE — TOC Progression Note (Signed)
Transition of Care [facility]) - Progression Note    Patient Details  Name: Terry Pruitt MRN: [id number] Date of Birth: 1961/07/23  [facility] CM/SW Contact  Barrie Dunker, RN Phone Number: 12/20/2020, 2:38 PM  Clinical Narrative:   [facility], the patient will [facility] his [facility] [place], [facility] was [facility] with [facility]        Expected Discharge Plan and Services                                                 Social Determinants of Health (SDOH) Interventions    Readmission Risk Interventions No flowsheet data found.

## 2020-12-20 NOTE — Progress Notes (Signed)
Initial Nutrition Assessment  DOCUMENTATION CODES:  Severe malnutrition in context of acute illness/injury  INTERVENTION:   Continue current diet as ordered  Add MVI with minerals daily  Ensure Enlive po BID, each supplement provides 350 kcal and 20 grams of protein  NUTRITION DIAGNOSIS:  Severe Malnutrition related to poor appetite (in the context of acute illness) as evidenced by moderate fat depletion,moderate muscle depletion.  GOAL:  Patient will meet greater than or equal to 90% of their needs  MONITOR:  PO intake,Supplement acceptance  REASON FOR ASSESSMENT:  Malnutrition Screening Tool    ASSESSMENT:  Pt presented to ED with left hand swelling and pain x 4 days. Believes he was bitten by a spider. Red streaks noted on his arm. Pt reports poor appetite PTA. PMH includes DM type 2, COPD, HTN, HLD. Has not taken DM medications for 7 months per pt report.  Pt resting in bed at the time of visit watching TV. Pt endorses weight loss and poor appetite. States that intake has been decreased due to depression over the last 6 months and stress over his financial situation and inability to afford his medications. States since being admitted, he has been eating well. Denies any GI distress at this time. Is agreeable to a nutrition supplement due to muscle and fat deficits on exam. 4.2% weight loss noted over the last month which is not severe.  Average Meal Intake:  4/24-4/25: 100% intake x 1 recorded meal  Relevant Scheduled Meds: . insulin aspart  0-5 Units Subcutaneous QHS  . insulin aspart  0-9 Units Subcutaneous TID WC   Relevant Continuous Infusions: . ceFEPime (MAXIPIME) IV 2 g (12/20/20 0325)  . vancomycin 1,000 mg (12/20/20 0522)   Labs reviewed:  Na 134  SBG ranges from 196-205 mg/dL over the last 24 hours  Last HgbA1c 7.3% (4/24)  NUTRITION - FOCUSED PHYSICAL EXAM: Flowsheet Row Most Recent Value  Orbital Region Moderate depletion  Upper Arm Region  Moderate depletion  Thoracic and Lumbar Region Mild depletion  Buccal Region Mild depletion  Temple Region Mild depletion  Clavicle Bone Region Mild depletion  Clavicle and Acromion Bone Region Mild depletion  Scapular Bone Region Mild depletion  Dorsal Hand Mild depletion  Patellar Region Severe depletion  Anterior Thigh Region Severe depletion  Posterior Calf Region Moderate depletion  Edema (RD Assessment) None  Hair Reviewed  Eyes Reviewed  Mouth Reviewed  Skin Reviewed  Nails Reviewed     Diet Order:   Diet Order            Diet heart healthy/carb modified Room service appropriate? Yes; Fluid consistency: Thin  Diet effective now                 EDUCATION NEEDS:  No education needs have been identified at this time  Skin:  Skin Assessment: Reviewed RN Assessment  Last BM:  4/23 per RN documentation  Height:  Ht Readings from Last 1 Encounters:  12/19/20 6' (1.829 m)    Weight:  Wt Readings from Last 1 Encounters:  12/19/20 72.6 kg    Ideal Body Weight:  80.9 kg  BMI:  Body mass index is 21.7 kg/m.  Estimated Nutritional Needs:   Kcal:  2100-2300 kcal/d  Protein:  105-115 g/d  Fluid:  >2L/d   Greig Castilla, RD, LDN Clinical Dietitian Pager on Amion

## 2020-12-20 NOTE — Progress Notes (Signed)
Pt refusing Heparin and Insulin, education provided; MD notified.

## 2020-12-20 NOTE — Progress Notes (Signed)
Patient refusing Tdap vaccine, MD notified, order canceled.

## 2020-12-20 NOTE — Consult Note (Signed)
ORTHOPAEDIC CONSULTATION  REQUESTING PHYSICIAN: Enedina Finner, MD  Chief Complaint: Left ring finger infection with progressive cellulitis  HPI: Terry Pruitt is a 60 y.o. male who complains of worsening pain, erythema spreading from his left hand into the left forearm.  Symptoms have worsened over the last few days after what he reports to be a "spider bite".  Patient was admitted to the hospitalist service and started on IV cefepime and vancomycin.  Streaking erythema has improved, although patient still has swelling in the fingers and erythema localized to the dorsum of the proximal phalanx of the left ring finger.  Orthopedics was consulted regarding potential operative management.  Past Medical History:  Diagnosis Date  . COPD (chronic obstructive pulmonary disease) (HCC)   . Degenerative arthritis   . Diabetes mellitus without complication (HCC)   . Severe cholesterol   . Hypertension   . Sciatic pain    Past Surgical History:  Procedure Laterality Date  . left leg surgery     Social History   Socioeconomic History  . Marital status: Single    Spouse name: Not on file  . Number of children: Not on file  . Years of education: Not on file  . Highest education level: Not on file  Occupational History  . Not on file  Tobacco Use  . Smoking status: Current Every Day Smoker    Packs/day: 1.00    Types: Cigarettes  . Smokeless tobacco: Never Used  Substance and Sexual Activity  . Alcohol use: No  . Drug use: Yes    Types: Marijuana  . Sexual activity: Not on file  Other Topics Concern  . Not on file  Social History Narrative  . Not on file   Social Determinants of Health   Financial Resource Strain: Not on file  Food Insecurity: Not on file  Transportation Needs: Not on file  Physical Activity: Not on file  Stress: Not on file  Social Connections: Not on file   Family History  Problem Relation Age of Onset  . COPD Mother   . Heart disease Father   . Diabetes  Mellitus II Father    Allergies  Allergen Reactions  . Hydrocodone-Acetaminophen Itching    Pt states he can tolerate   Prior to Admission medications   Medication Sig Start Date End Date Taking? Authorizing Provider  cyclobenzaprine (FLEXERIL) 10 MG tablet Take 10 mg by mouth 3 (three) times daily as needed for muscle spasms.   Yes [provider]  gabapentin (NEURONTIN) 300 MG capsule Take 300 mg by mouth 2 (two) times daily.   Yes [provider]  glimepiride (AMARYL) 1 MG tablet Take 1 mg by mouth every morning. 07/29/20  Yes [provider]  metFORMIN (GLUCOPHAGE) 1000 MG tablet Take 1 tablet by mouth 2 (two) times daily.   Yes [provider]  predniSONE (STERAPRED UNI-PAK 21 TAB) 10 MG (21) TBPK tablet Take by mouth daily. 6,5,4,3,2,1 Patient not taking: No sig reported 12/03/20   Orvil Feil, PA-C   DG Hand Complete Left  Result Date: 12/19/2020 CLINICAL DATA:  Left hand pain and swelling for 4 days. Possible spider bite. EXAM: LEFT HAND - COMPLETE 3+ VIEW COMPARISON:  None. FINDINGS: Degenerative changes in the interphalangeal joints. No evidence of acute fracture or dislocation. No focal bone lesion or bone destruction. Mild soft tissue swelling over the dorsum of the hand. No radiopaque foreign bodies or soft tissue gas collections. IMPRESSION: Degenerative changes in the interphalangeal joints.  No acute bony abnormalities. Soft tissue swelling. Electronically Signed   By: Burman Nieves M.D.   On: 12/19/2020 00:39    Positive ROS: All other systems have been reviewed and were otherwise negative with the exception of those mentioned in the HPI and as above.  Physical Exam: General: Alert, no acute distress Cardiovascular: No pedal edema Respiratory: No cyanosis, no use of accessory musculature GI: No organomegaly, abdomen is soft and non-tender Skin: No lesions in the area of chief complaint Neurologic: Sensation intact  distally Psychiatric: Patient is competent for consent with normal mood and affect Lymphatic: No axillary or cervical lymphadenopathy  MUSCULOSKELETAL: Left upper extremity, area of erythema and slight induration along the dorsal and radial aspect of the proximal phalanx of the index finger, no drainage, mild swelling in the fingers and dorsum of the hand, no swelling or streaking erythema into the forearm, patient does not have notable tenderness along the flexor tendon sheath of the ring finger or any of the other digits, fingers are held in a slightly flexed posturing, is able to tolerate some passive extension of the ring finger another digits  Assessment/Plan: Left ring finger soft tissue infection. Patient was started on IV vancomycin and cefepime yesterday and has made some improvements with respect to the erythema, swelling, pain into the wrist and forearm.  He still has localized erythema along the dorsum of the proximal phalanx.  We will plan to continue to monitor for improvement on IV antibiotics.  Recommend Carter pillow for strict elevation of the left hand to minimize swelling.  If finger erythema persists, may consider MRI for evaluation of abscess.    Ross Marcus, MD   12/20/2020 8:04 AM

## 2020-12-20 NOTE — Progress Notes (Signed)
[facility] [place] NAME: Terry Pruitt    MR#:  [id number]  DATE OF BIRTH:  June 09, 1961  SUBJECTIVE:  Left middle finger pain some better. Able to wiggle fingers today  no fever Seen by ortho REVIEW OF SYSTEMS:   Review of Systems  Constitutional: Negative for chills, fever and weight loss.  HENT: Negative for ear discharge, ear pain and nosebleeds.   Eyes: Negative for blurred vision, pain and discharge.  Respiratory: Negative for sputum production, shortness of breath, wheezing and stridor.   Cardiovascular: Negative for chest pain, palpitations, orthopnea and PND.  Gastrointestinal: Negative for abdominal pain, diarrhea, nausea and vomiting.  Genitourinary: Negative for frequency and urgency.  Musculoskeletal: Positive for joint pain. Negative for back pain.  Neurological: Negative for sensory change, speech change, focal weakness and weakness.  Psychiatric/Behavioral: Negative for depression and hallucinations. The patient is not nervous/anxious.    Tolerating Diet:yes Tolerating PT:   DRUG ALLERGIES:   Allergies  Allergen Reactions  . Hydrocodone-Acetaminophen Itching    Pt states he can tolerate    VITALS:  Blood pressure 130/76, pulse 73, temperature 97.9 F (36.6 C), temperature source Oral, resp. rate 16, height 6' (1.829 m), weight 72.6 kg, SpO2 100 %.  PHYSICAL EXAMINATION:   Physical Exam  GENERAL:  60 y.o.-year-old patient lying in the bed with no acute distress.  Looks older than his age! LUNGS: Normal breath sounds bilaterally, no wheezing, rales, rhonchi. No use of accessory muscles of respiration.  CARDIOVASCULAR: S1, S2 normal. No murmurs, rubs, or gallops.  ABDOMEN: Soft, nontender, nondistended. Bowel sounds present. No organomegaly or mass.  EXTREMITIES:  Left hand   NEUROLOGIC: Cranial nerves II through XII are intact. No focal Motor or sensory deficits b/l.   PSYCHIATRIC:  patient is alert and oriented  x 3.  SKIN: No obvious rash, lesion, or ulcer.   LABORATORY PANEL:  CBC Recent Labs  Lab 12/20/20 0310  WBC 8.4  HGB 13.6  HCT 40.5  PLT 218    Chemistries  Recent Labs  Lab 12/19/20 0025 12/20/20 0310  NA 137 134*  K 3.4* 4.2  CL 103 102  CO2 25 25  GLUCOSE 196* 205*  BUN 20 20  CREATININE 1.12 0.79  CALCIUM 9.0 8.8*  AST 14*  --   ALT 16  --   ALKPHOS 73  --   BILITOT 0.6  --    Cardiac Enzymes No results for input(s): TROPONINI in the last 168 hours. RADIOLOGY:  DG Hand Complete Left  Result Date: 12/19/2020 CLINICAL DATA:  Left hand pain and swelling for 4 days. Possible spider bite. EXAM: LEFT HAND - COMPLETE 3+ VIEW COMPARISON:  None. FINDINGS: Degenerative changes in the interphalangeal joints. No evidence of acute fracture or dislocation. No focal bone lesion or bone destruction. Mild soft tissue swelling over the dorsum of the hand. No radiopaque foreign bodies or soft tissue gas collections. IMPRESSION: Degenerative changes in the interphalangeal joints. No acute bony abnormalities. Soft tissue swelling. Electronically Signed   By: Lucienne Capers M.D.   On: 12/19/2020 00:39   ASSESSMENT AND PLAN:   Terry Pruitt is a 60 y.o. male with medical history significant of HTN, HLD, DM, COPD, tobacco abuse, who presents with left hand and forearm pain.  Pt states that he had possible spider bite to his left 4th finger 4 days ago. His 4th finger started swelling, becomes red and painful.  Cellulitis of left upper extremity:  Moderate nonpurulent --Patient does not have sepsis.  -- Dr. Sharlet Salina of orthopedic surgeon.  recommended to continue antibiotics and consider I and d if worsens --pt requested to keep hand elevated --Supply chain DOES not have Crater pillow --per Cellulitis order set--change to IV cefazolin -Pain control: As needed Dilaudid, Percocet, Tylenol - ESR and CRP--elevated --BC negative  HTN (hypertension): Patient is not taking medications  currently.  Blood pressure 122/83 -IV hydralazine as needed  Diabetes mellitus without complication ([place]): Y6H 7.3  well controlled. Patient is not taking medications--placed on Metformin bid -Sliding scale insulin   COPD (chronic obstructive pulmonary disease) ([place]): Stable -As needed albuterol  Tobacco abuse, cocaine abuse (positive on UDS) -Nicotine patch --recommend to abstain from using drugs  DVT ppx: SQ Heparin     Code Status: Full code Family Communication: not done, no family member is at bed side.   Disposition Plan:  Anticipate discharge back to previous environment Consults called:  Dr. Sharlet Salina of orthopedic surgery is consulted Admission status and Level of care: Med-Surg:    as inpt       Status is: Inpatient  Remains inpatient appropriate because:Inpatient level of care appropriate due to severity of illness   Dispo: The patient is from: Home  Anticipated d/c is to: Home  Patient currently is not medically stable to d/c.              Difficult to place patient No  Level of care: Med-Surg       TOTAL TIME TAKING CARE OF THIS PATIENT: *25* minutes.  >50% time spent on counselling and coordination of care  Note: This dictation was prepared with Dragon dictation along with smaller phrase technology. Any transcriptional errors that result from this process are unintentional.  Fritzi Mandes M.D    [facility]   CC: Primary care physician; Patient, No Pcp Per (Inactive)Patient ID: Terry Pruitt, male   DOB: 11-04-1960, 60 y.o.   MRN: [id number]

## 2020-12-21 ENCOUNTER — Other Ambulatory Visit: Payer: Self-pay

## 2020-12-21 DIAGNOSIS — E43 Unspecified severe protein-calorie malnutrition: Secondary | ICD-10-CM | POA: Insufficient documentation

## 2020-12-21 LAB — GLUCOSE, CAPILLARY
Glucose-Capillary: 106 mg/dL — ABNORMAL HIGH (ref 70–99)
Glucose-Capillary: 142 mg/dL — ABNORMAL HIGH (ref 70–99)

## 2020-12-21 MED ORDER — CEPHALEXIN 500 MG PO CAPS
500.0000 mg | ORAL_CAPSULE | Freq: Three times a day (TID) | ORAL | Status: DC
  Administered 2020-12-21 (×2): 500 mg via ORAL
  Filled 2020-12-21 (×2): qty 1

## 2020-12-21 MED ORDER — ADULT MULTIVITAMIN W/MINERALS CH
1.0000 | ORAL_TABLET | Freq: Every day | ORAL | 0 refills | Status: DC
  Filled 2020-12-21: qty 30, 30d supply, fill #0

## 2020-12-21 MED ORDER — GABAPENTIN 300 MG PO CAPS
300.0000 mg | ORAL_CAPSULE | Freq: Two times a day (BID) | ORAL | 0 refills | Status: DC
  Filled 2020-12-21: qty 14, 7d supply, fill #0

## 2020-12-21 MED ORDER — METFORMIN HCL 1000 MG PO TABS
1.0000 | ORAL_TABLET | Freq: Two times a day (BID) | ORAL | 2 refills | Status: DC
  Filled 2020-12-21: qty 60, 30d supply, fill #0
  Filled 2021-02-03: qty 60, 30d supply, fill #1
  Filled 2021-03-25: qty 60, 30d supply, fill #2

## 2020-12-21 MED ORDER — IBUPROFEN 800 MG PO TABS
400.0000 mg | ORAL_TABLET | Freq: Three times a day (TID) | ORAL | 0 refills | Status: AC | PRN
  Filled 2020-12-21: qty 8, 5d supply, fill #0

## 2020-12-21 MED ORDER — CEPHALEXIN 500 MG PO CAPS
500.0000 mg | ORAL_CAPSULE | Freq: Three times a day (TID) | ORAL | 0 refills | Status: AC
  Filled 2020-12-21: qty 18, 6d supply, fill #0

## 2020-12-21 NOTE — Progress Notes (Signed)
Patient is refusing Heparin.  MD is aware.

## 2020-12-21 NOTE — Discharge Instructions (Signed)
Advised abstain from using cocaine and stop drinking ETOH

## 2020-12-21 NOTE — TOC Transition Note (Signed)
Transition of Care [facility]) - CM/SW Discharge Note   Patient Details  Name: Terry Pruitt MRN: [id number] Date of Birth: 01-23-1961  Transition of Care [facility]) CM/SW Contact:  Caryn Section, RN Phone Number: 12/21/2020, 10:45 AM   Clinical Narrative:   Patient discharging today.  Medications being provided through medication management.  Patient has information to [facility]. Will bring to next appt.  No further questions for [facility]          Patient Goals and CMS Choice        Discharge Placement                       Discharge Plan and Services                                     Social Determinants of Health (SDOH) Interventions     Readmission Risk Interventions No flowsheet data found.

## 2020-12-21 NOTE — Progress Notes (Signed)
Discharge instructions reviewed with the pt, see AVS. RN discussed f/u appointments at clinic needing to be made, pt has application at bedside. Also, medications to be picked up across the street for free, activity and wound care. All questions answered. Pt ambulated to front of hospital for discharge and is awaiting his ride. Pt insisted on going out prior to his ride coming and stated he would be responsible for himself.

## 2020-12-21 NOTE — Discharge Summary (Signed)
[facility] [place] NAME: Terry Pruitt    MR#:  [id number]  DATE OF BIRTH:  01/05/1961  DATE OF ADMISSION:  12/19/2020 ADMITTING PHYSICIAN: Ivor Costa, MD  DATE OF DISCHARGE: 12/21/2020  PRIMARY CARE PHYSICIAN: Patient, No Pcp Per (Inactive)    ADMISSION DIAGNOSIS:  Cellulitis of left arm [L03.114] Cellulitis of hand, left [L03.114] Left arm cellulitis [L03.114] Cellulitis of left upper extremity [L03.114]  DISCHARGE DIAGNOSIS:  Cellulitis left hand post bug bite  SECONDARY DIAGNOSIS:   Past Medical History:  Diagnosis Date  . COPD (chronic obstructive pulmonary disease) ([place])   . Degenerative arthritis   . Diabetes mellitus without complication ([place])   . Ose cholesterol   . Hypertension   . Sciatic pain     HOSPITAL COURSE:   Terry Pruitt Highis a 60 y.o.malewith medical history significant ofHTN, HLD, DM, COPD, tobacco abuse, who presents with left hand and forearm pain. Pt states that he had possible spider bite to his left 4th finger 4 days ago. His 4th finger started swelling, becomesred and painful.  Cellulitis of left upper extremity:Moderate nonpurulent --Patient does not have sepsis.  -- Dr. Sharlet Salina of orthopedic surgeon. recommended to continue antibiotics and ok to d/c today --pt requested to keep hand elevatedpillow --per Cellulitis order set--change to IV cefazolin--now on po keflex -Pain control: As needed tylenol and ibuprofen - ESR and CRP--elevated --BC negative  HTN (hypertension): Patient is not taking medications currently. Blood pressure 122/83 -IV hydralazine as needed  Diabetes mellitus without complication ([place]):V0J 7.3  well controlled. Patient is not taking medications--placed on Metformin bid -Sliding scale insulin  COPD (chronic obstructive pulmonary disease) ([place]): Stable -As needed albuterol  Tobacco abuse, cocaine abuse (positive on UDS) -Nicotine patch --recommend to abstain  from using drugs  DVT ppx: SQ Heparin  Code Status:Full code Family Communication: not done, no family member is at bed side.  Disposition Plan: Anticipate discharge back to previous environment Consults called:Dr.Crawford of orthopedic surgery \ Admission status and Level of care:Med-Surg: as inpt    Status is: Inpatient \   Dispo: The patient is from:Home Anticipated d/c is JK:KXFG Patient currently is  medically stable to d/c. Difficult to place patient No  Level of care: Med-Surg  CONSULTS OBTAINED:  Treatment Team:  Renee Harder, MD  DRUG ALLERGIES:   Allergies  Allergen Reactions  . Hydrocodone-Acetaminophen Itching    Pt states he can tolerate    DISCHARGE MEDICATIONS:   Allergies as of 12/21/2020      Reactions   Hydrocodone-acetaminophen Itching   Pt states he can tolerate      Medication List    STOP taking these medications   glimepiride 1 MG tablet Commonly known as: AMARYL     TAKE these medications   cephALEXin 500 MG capsule Commonly known as: KEFLEX Take 1 capsule (500 mg total) by mouth 3 (three) times daily for 6 days.   cyclobenzaprine 10 MG tablet Commonly known as: FLEXERIL Take 10 mg by mouth 3 (three) times daily as needed for muscle spasms.   gabapentin 300 MG capsule Commonly known as: NEURONTIN Take 1 capsule (300 mg total) by mouth 2 (two) times daily for 7 days.   ibuprofen 400 MG tablet Commonly known as: ADVIL Take 1 tablet (400 mg total) by mouth every 8 (eight) hours as needed.   metFORMIN 1000 MG tablet Commonly known as: GLUCOPHAGE Take 1 tablet (1,000 mg total) by mouth 2 (two) times daily.  multivitamin with minerals Tabs tablet Take 1 tablet by mouth daily. Start taking on: December 22, 2020       If you experience worsening of your admission symptoms, develop shortness of breath, life threatening emergency, suicidal or homicidal thoughts  you must seek medical attention immediately by calling 911 or calling your MD immediately  if symptoms less severe.  You Must read complete instructions/literature along with all the possible adverse reactions/side effects for all the Medicines you take and that have been prescribed to you. Take any new Medicines after you have completely understood and accept all the possible adverse reactions/side effects.   Please note  You were cared for by a hospitalist during your hospital stay. If you have any questions about your discharge medications or the care you received while you were in the hospital after you are discharged, you can call the unit and asked to speak with the hospitalist on call if the hospitalist that took care of you is not available. Once you are discharged, your primary care physician will handle any further medical issues. Please note that NO REFILLS for any discharge medications will be authorized once you are discharged, as it is imperative that you return to your primary care physician (or establish a relationship with a primary care physician if you do not have one) for your aftercare needs so that they can reassess your need for medications and monitor your lab values. Today   SUBJECTIVE   Overall much improved swelling and tightness of fingers. No fever  VITAL SIGNS:  Blood pressure (!) 143/93, pulse 84, temperature 98.6 F (37 C), resp. rate 16, height 6' (1.829 m), weight 72.6 kg, SpO2 100 %.  I/O:    Intake/Output Summary (Last 24 hours) at 12/21/2020 0816 Last data filed at 12/20/2020 2222 Gross per 24 hour  Intake 400 ml  Output --  Net 400 ml    PHYSICAL EXAMINATION:  GENERAL:  60 y.o.-year-old patient lying in the bed with no acute distress.  EYES: Pupils equal, round, reactive to light and accommodation. No scleral icterus.  HEENT: Head atraumatic, normocephalic. Oropharynx and nasopharynx clear.  NECK:  Supple, no jugular venous distention. No thyroid  enlargement, no tenderness.  LUNGS: Normal breath sounds bilaterally, no wheezing, rales,rhonchi or crepitation. No use of accessory muscles of respiration.  CARDIOVASCULAR: S1, S2 normal. No murmurs, rubs, or gallops.  ABDOMEN: Soft, non-tender, non-distended. Bowel sounds present. No organomegaly or mass.  EXTREMITIES: right hand on 4/26  No fluctuant swelling, no drianage NEUROLOGIC:non focalPSYCHIATRIC: The patient is alert and oriented x 3.  SKIN: No obvious rash, lesion, or ulcer.   DATA REVIEW:   CBC  Recent Labs  Lab 12/20/20 0310  WBC 8.4  HGB 13.6  HCT 40.5  PLT 218    Chemistries  Recent Labs  Lab 12/19/20 0025 12/20/20 0310  NA 137 134*  K 3.4* 4.2  CL 103 102  CO2 25 25  GLUCOSE 196* 205*  BUN 20 20  CREATININE 1.12 0.79  CALCIUM 9.0 8.8*  AST 14*  --   ALT 16  --   ALKPHOS 73  --   BILITOT 0.6  --     Microbiology Results   Recent Results (from the past 240 hour(s))  Blood culture (routine x 2)     Status: None (Preliminary result)   Collection Time: 12/19/20 12:25 AM   Specimen: BLOOD  Result Value Ref Range Status   Specimen Description BLOOD RIGHT ANTECUBITAL  Final  Special Requests   Final    BOTTLES DRAWN AEROBIC AND ANAEROBIC Blood Culture adequate volume   Culture   Final    NO GROWTH 2 DAYS Performed at [facility], [address] [place], [place] [address]    Report Status PENDING  Incomplete  Resp Panel by RT-PCR (Flu A&B, Covid) Nasopharyngeal Swab     Status: None   Collection Time: 12/19/20  4:20 AM   Specimen: Nasopharyngeal Swab; Nasopharyngeal(NP) swabs in vial transport medium  Result Value Ref Range Status   SARS Coronavirus 2 by RT PCR NEGATIVE NEGATIVE Final    Comment: (NOTE) SARS-CoV-2 target nucleic acids are NOT DETECTED.  The SARS-CoV-2 RNA is generally detectable in upper respiratory specimens during the acute phase of infection. The lowest concentration of SARS-CoV-2 viral copies this assay can  detect is 138 copies/mL. A negative result does not preclude SARS-Cov-2 infection and should not be used as the sole basis for treatment or other patient management decisions. A negative result may occur with  improper specimen collection/handling, submission of specimen other than nasopharyngeal swab, presence of viral mutation(s) within the areas targeted by this assay, and inadequate number of viral copies(<138 copies/mL). A negative result must be combined with clinical observations, patient history, and epidemiological information. The expected result is Negative.  Fact Sheet for Patients:  EntrepreneurPulse.com.au  Fact Sheet for Healthcare Providers:  IncredibleEmployment.be  This test is no t yet approved or cleared by the [place] FDA and  has been authorized for detection and/or diagnosis of SARS-CoV-2 by FDA under an Emergency Use Authorization (EUA). This EUA will remain  in effect (meaning this test can be used) for the duration of the COVID-19 declaration under Section 564(b)(1) of the Act, 21 U.S.C.section 360bbb-3(b)(1), unless the authorization is terminated  or revoked sooner.       Influenza A by PCR NEGATIVE NEGATIVE Final   Influenza B by PCR NEGATIVE NEGATIVE Final    Comment: (NOTE) The Xpert Xpress SARS-CoV-2/FLU/RSV plus assay is intended as an aid in the diagnosis of influenza from Nasopharyngeal swab specimens and should not be used as a sole basis for treatment. Nasal washings and aspirates are unacceptable for Xpert Xpress SARS-CoV-2/FLU/RSV testing.  Fact Sheet for Patients: EntrepreneurPulse.com.au  Fact Sheet for Healthcare Providers: IncredibleEmployment.be  This test is not yet approved or cleared by the [place] FDA and has been authorized for detection and/or diagnosis of SARS-CoV-2 by FDA under an Emergency Use Authorization (EUA). This EUA will remain in  effect (meaning this test can be used) for the duration of the COVID-19 declaration under Section 564(b)(1) of the Act, 21 U.S.C. section 360bbb-3(b)(1), unless the authorization is terminated or revoked.  Performed at [facility], [address] [place], [place] [address]   Blood culture (routine x 2)     Status: None (Preliminary result)   Collection Time: 12/19/20  7:07 AM   Specimen: BLOOD  Result Value Ref Range Status   Specimen Description BLOOD BLOOD LEFT ARM  Final   Special Requests   Final    BOTTLES DRAWN AEROBIC AND ANAEROBIC Blood Culture adequate volume   Culture   Final    NO GROWTH 2 DAYS Performed at [facility], [address], [place], [place] [address]    Report Status PENDING  Incomplete    RADIOLOGY:  No results found.   CODE STATUS:     Code Status Orders  (From admission, onward)  Start     Ordered   12/19/20 1015  Full code  Continuous        12/19/20 1014        Code Status History    This patient has a current code status but no historical code status.   Advance Care Planning Activity       TOTAL TIME TAKING CARE OF THIS PATIENT: *40* minutes.    Fritzi Mandes M.D  [facility]    CC: Primary care physician; Patient, No Pcp Per (Inactive)

## 2020-12-23 ENCOUNTER — Telehealth: Payer: Self-pay | Admitting: Emergency Medicine

## 2020-12-23 ENCOUNTER — Telehealth: Payer: Self-pay

## 2020-12-23 NOTE — Telephone Encounter (Signed)
Tried calling pt to discuss becoming a pt here, but was unable to reach them with this phone number.   MD 4/28 @ 2:04 pm

## 2020-12-24 LAB — CULTURE, BLOOD (ROUTINE X 2)
Culture: NO GROWTH
Culture: NO GROWTH
Special Requests: ADEQUATE
Special Requests: ADEQUATE

## 2021-02-03 ENCOUNTER — Other Ambulatory Visit: Payer: Self-pay

## 2021-03-25 ENCOUNTER — Other Ambulatory Visit: Payer: Self-pay

## 2021-03-28 ENCOUNTER — Other Ambulatory Visit: Payer: Self-pay

## 2021-03-29 ENCOUNTER — Ambulatory Visit: Payer: Medicaid Other | Admitting: Pharmacy Technician

## 2021-03-29 ENCOUNTER — Other Ambulatory Visit: Payer: Self-pay

## 2021-03-29 DIAGNOSIS — Z79899 Other long term (current) drug therapy: Secondary | ICD-10-CM

## 2021-03-29 NOTE — Progress Notes (Signed)
Attempted to call patient using phone number in [facility].  Male answered phone and stated that I had wrong number.  Stated that [employer] did not live at his residence.    [facility] unable to provide additional medication assistance until patient provides proof of income.  Patient notified by letter.  Sherilyn Dacosta Care Manager [facility]   [address] [place], [place]  [address]   March 29, 2021    [facility] [address] [place], [place]  [address]  Dear Terry Pruitt,  This is to inform you that you are no longer eligible to receive medication assistance at [facility].  The reason(s) are:    _____Your total gross monthly household income exceeds 250% of the Federal Poverty Level.   _____Tangible assets (savings, checking, stocks/bonds, pension, retirement, etc.) exceeds our limit  _____You are eligible to receive benefits from [facility], [facility] or HIV Medication Assistance             Program _____You are eligible to receive benefits from a Medicare Part "D" plan _____You have prescription insurance  _____You are not an [facility] resident __X__ Failure to provide all requested documentation (proof of income information for 2022, and/or              Patient Intake Application, DOH Attestation, Contract and Patient Advocate Form).  Medication assistance will resume once all requested financial information has been returned to our clinic.  If you have questions, please contact our clinic at [phone].    Thank you,

## 2021-04-19 ENCOUNTER — Other Ambulatory Visit: Payer: Self-pay

## 2021-04-25 ENCOUNTER — Other Ambulatory Visit: Payer: Self-pay

## 2022-04-06 NOTE — Progress Notes (Deleted)
   I,Terry Pruitt,acting as a Neurosurgeon for [employer], PA-C.,have documented all relevant documentation on the behalf of Terry Lat, PA-C,as directed by  [employer], PA-C while in the presence of [employer], PA-C.  New Patient Office Visit  Subjective    Patient ID: Terry Pruitt, male    DOB: 06-22-1961  Age: 61 y.o. MRN: [id number]  CC: No chief complaint on file.   HPI Terry Pruitt presents to establish care ***  Outpatient Encounter Medications as of 04/07/2022  Medication Sig   cyclobenzaprine (FLEXERIL) 10 MG tablet Take 10 mg by mouth 3 (three) times daily as needed for muscle spasms.   gabapentin (NEURONTIN) 300 MG capsule Take 1 capsule (300 mg total) by mouth 2 (two) times daily for 7 days.   ibuprofen (ADVIL) 800 MG tablet Take 0.5 tablet (400 mg total) by mouth every 8 (eight) hours as needed.   metFORMIN (GLUCOPHAGE) 1000 MG tablet Take 1 tablet (1,000 mg total) by mouth 2 (two) times daily.   Multiple Vitamin (MULTIVITAMIN WITH MINERALS) TABS tablet Take 1 tablet by mouth daily.   No facility-administered encounter medications on file as of 04/07/2022.    Past Medical History:  Diagnosis Date   COPD (chronic obstructive pulmonary disease) (HCC)    Degenerative arthritis    Diabetes mellitus without complication (HCC)    Netz cholesterol    Hypertension    Sciatic pain     Past Surgical History:  Procedure Laterality Date   left leg surgery      Family History  Problem Relation Age of Onset   COPD Mother    Heart disease Father    Diabetes Mellitus II Father     Social History   Socioeconomic History   Marital status: Single    Spouse name: Not on file   Number of children: Not on file   Years of education: Not on file   Highest education level: Not on file  Occupational History   Not on file  Tobacco Use   Smoking status: Every Day    Packs/day: 1.00    Types: Cigarettes   Smokeless tobacco: Never  Substance and Sexual Activity    Alcohol use: No   Drug use: Yes    Types: Marijuana   Sexual activity: Not on file  Other Topics Concern   Not on file  Social History Narrative   Not on file   Social Determinants of Health   Financial Resource Strain: Not on file  Food Insecurity: Not on file  Transportation Needs: Not on file  Physical Activity: Not on file  Stress: Not on file  Social Connections: Not on file  Intimate Partner Violence: Not on file    ROS      Objective    There were no vitals taken for this visit.  Physical Exam  {Labs (Optional):23779}    Assessment & Plan:   Problem List Items Addressed This Visit   None   No follow-ups on file.   Sarina Ill, CMA

## 2022-04-07 ENCOUNTER — Ambulatory Visit: Payer: Medicaid Other | Admitting: Physician Assistant

## 2022-09-04 ENCOUNTER — Other Ambulatory Visit: Payer: Self-pay

## 2022-09-04 ENCOUNTER — Emergency Department
Admission: EM | Admit: 2022-09-04 | Discharge: 2022-09-04 | Disposition: A | Discharge status: Home / Self Care | Payer: Medicaid Other | Attending: Emergency Medicine | Admitting: Emergency Medicine

## 2022-09-04 ENCOUNTER — Emergency Department: Payer: Medicaid Other

## 2022-09-04 DIAGNOSIS — R0602 Shortness of breath: Secondary | ICD-10-CM

## 2022-09-04 DIAGNOSIS — J441 Chronic obstructive pulmonary disease with (acute) exacerbation: Secondary | ICD-10-CM | POA: Insufficient documentation

## 2022-09-04 DIAGNOSIS — R051 Acute cough: Secondary | ICD-10-CM

## 2022-09-04 DIAGNOSIS — R059 Cough, unspecified: Secondary | ICD-10-CM | POA: Diagnosis present

## 2022-09-04 DIAGNOSIS — Z1152 Encounter for screening for COVID-19: Secondary | ICD-10-CM | POA: Insufficient documentation

## 2022-09-04 DIAGNOSIS — B338 Other specified viral diseases: Secondary | ICD-10-CM

## 2022-09-04 DIAGNOSIS — B974 Respiratory syncytial virus as the cause of diseases classified elsewhere: Secondary | ICD-10-CM | POA: Diagnosis not present

## 2022-09-04 LAB — BASIC METABOLIC PANEL
Anion gap: 10 (ref 5–15)
BUN: 19 mg/dL (ref 6–20)
CO2: 22 mmol/L (ref 22–32)
Calcium: 8.8 mg/dL — ABNORMAL LOW (ref 8.9–10.3)
Chloride: 101 mmol/L (ref 98–111)
Creatinine, Ser: 0.88 mg/dL (ref 0.61–1.24)
GFR, Estimated: >60 mL/min (ref >60)
Glucose, Bld: 210 mg/dL — ABNORMAL HIGH (ref 70–99)
Potassium: 4.1 mmol/L (ref 3.5–5.1)
Sodium: 133 mmol/L — ABNORMAL LOW (ref 135–145)

## 2022-09-04 LAB — CBC
HCT: 44 % (ref 39.0–52.0)
Hemoglobin: 14.7 g/dL (ref 13.0–17.0)
MCH: 32.2 pg (ref 26.0–34.0)
MCHC: 33.4 g/dL (ref 30.0–36.0)
MCV: 96.3 fL (ref 80.0–100.0)
Platelets: 222 10*3/uL (ref 150–400)
RBC: 4.57 MIL/uL (ref 4.22–5.81)
RDW: 12 % (ref 11.5–15.5)
WBC: 10.2 10*3/uL (ref 4.0–10.5)
nRBC: 0 % (ref 0.0–0.2)

## 2022-09-04 LAB — RESP PANEL BY RT-PCR (RSV, FLU A&B, COVID)  RVPGX2
Influenza A by PCR: NEGATIVE
Influenza B by PCR: NEGATIVE
Resp Syncytial Virus by PCR: POSITIVE — AB
SARS Coronavirus 2 by RT PCR: NEGATIVE

## 2022-09-04 LAB — TROPONIN I (HIGH SENSITIVITY)
Troponin I (High Sensitivity): 4 ng/L (ref <18)
Troponin I (High Sensitivity): 4 ng/L (ref <18)

## 2022-09-04 MED ORDER — PREDNISONE 10 MG (21) PO TBPK: ORAL_TABLET | ORAL | 0 refills | Status: AC

## 2022-09-04 MED ORDER — GABAPENTIN 300 MG PO CAPS: 300.0000 mg | ORAL_CAPSULE | Freq: Two times a day (BID) | ORAL | 0 refills | Status: AC

## 2022-09-04 MED ORDER — IPRATROPIUM-ALBUTEROL 0.5-2.5 (3) MG/3ML IN SOLN
3.0000 mL | Freq: Once | RESPIRATORY_TRACT | Status: AC
  Administered 2022-09-04: 3 mL via RESPIRATORY_TRACT
  Filled 2022-09-04: qty 3

## 2022-09-04 MED ORDER — ALBUTEROL SULFATE HFA 108 (90 BASE) MCG/ACT IN AERS: 2.0000 | INHALATION_SPRAY | Freq: Four times a day (QID) | RESPIRATORY_TRACT | 2 refills | Status: AC | PRN

## 2022-09-04 MED ORDER — METHYLPREDNISOLONE SODIUM SUCC 125 MG IJ SOLR
125.0000 mg | Freq: Once | INTRAMUSCULAR | Status: DC
  Filled 2022-09-04: qty 2

## 2022-09-04 MED ORDER — METHYLPREDNISOLONE SODIUM SUCC 125 MG IJ SOLR
125.0000 mg | Freq: Once | INTRAMUSCULAR | Status: AC
  Administered 2022-09-04: 125 mg via INTRAVENOUS

## 2022-09-04 MED ORDER — METFORMIN HCL 1000 MG PO TABS: 1000.0000 mg | ORAL_TABLET | Freq: Two times a day (BID) | ORAL | 2 refills | Status: DC

## 2022-09-04 MED ORDER — BUDESONIDE-FORMOTEROL FUMARATE 80-4.5 MCG/ACT IN AERO: 2.0000 | INHALATION_SPRAY | Freq: Two times a day (BID) | RESPIRATORY_TRACT | 12 refills | Status: DC

## 2022-09-04 MED ORDER — AZITHROMYCIN 250 MG PO TABS: ORAL_TABLET | ORAL | 0 refills | Status: AC

## 2022-09-04 NOTE — ED Provider Triage Note (Signed)
Emergency Medicine Provider Triage Evaluation Note  Terry Pruitt , a 62 y.o. male  was evaluated in triage.  Pt complains of complaints of cough, congestion, chest pain shortness of breath.  Review of Systems  Positive:  Negative:   Physical Exam  BP (!) 145/99   Pulse (!) 101   Temp 98.3 F (36.8 C)   Resp 20   Ht 5\' 11"  (1.803 m)   Wt 72.6 kg   SpO2 93%   BMI 22.32 kg/m  Gen:   Awake, no distress   Resp:  Normal effort  MSK:   Moves extremities without difficulty  Other:    Medical Decision Making  Medically screening exam initiated at 11:30 AM.  Appropriate orders placed.  Terry Pruitt was informed that the remainder of the evaluation will be completed by another provider, this initial triage assessment does not replace that evaluation, and the importance of remaining in the ED until their evaluation is complete.     Versie Starks, PA-C 09/04/22 1130

## 2022-09-04 NOTE — ED Triage Notes (Signed)
Pt to ED for chills, fatigue cough the past few days. +shob. DOE noted. Reports chest wall pain with cough

## 2022-09-04 NOTE — Discharge Instructions (Addendum)
Please call the Department of Social Services above to discuss your Medicaid coverage as you should have a primary care physician assigned to you

## 2022-09-04 NOTE — ED Provider Notes (Signed)
[facility] Provider Note   Event Date/Time   First MD Initiated Contact with Patient 09/04/22 1518     (approximate) History  No chief complaint on file.  HPI Terry Pruitt is a 62 y.o. male with a stated past medical history of COPD who presents for 3 days of worsening cough, generalized weakness, and shortness of breath.  Patient states that the shortness of breath does worsen with exertion however he denies any recent contacts with similar symptoms or recent travel.  Patient states that he has been off all of his medications for the past few months as he "lost my primary care physician".  Patient is requesting refills of these medications as well as a referral to primary care physician ROS: Patient currently denies any vision changes, tinnitus, difficulty speaking, facial droop, abdominal pain, nausea/vomiting/diarrhea, dysuria, or weakness/numbness/paresthesias in any extremity   Physical Exam  Triage Vital Signs: ED Triage Vitals  Enc Vitals Group     BP 09/04/22 1122 (!) 145/99     Pulse Rate 09/04/22 1122 (!) 101     Resp 09/04/22 1122 20     Temp 09/04/22 1122 98.3 F (36.8 C)     Temp src --      SpO2 09/04/22 1122 93 %     Weight 09/04/22 1124 160 lb (72.6 kg)     Height 09/04/22 1124 5\' 11"  (1.803 m)     Head Circumference --      Peak Flow --      Pain Score 09/04/22 1124 5     Pain Loc --      Pain Edu? --      Excl. in [place]? --    Most recent vital signs: Vitals:   09/04/22 1122  BP: (!) 145/99  Pulse: (!) 101  Resp: 20  Temp: 98.3 F (36.8 C)  SpO2: 93%   General: Awake, oriented x4. CV:  Good peripheral perfusion.  Resp:  Normal effort.  Inspiratory and expiratory wheezing over bilateral lung fields Abd:  No distention.  Other:  Middle-aged Caucasian male laying in stretcher in no acute distress ED Results / Procedures / Treatments  Labs (all labs ordered are listed, but only abnormal results are displayed) Labs Reviewed  RESP  PANEL BY RT-PCR (RSV, FLU A&B, COVID)  RVPGX2 - Abnormal; Notable for the following components:      Result Value   Resp Syncytial Virus by PCR POSITIVE (*)    All other components within normal limits  BASIC METABOLIC PANEL - Abnormal; Notable for the following components:   Sodium 133 (*)    Glucose, Bld 210 (*)    Calcium 8.8 (*)    All other components within normal limits  CBC  TROPONIN I ([place] SENSITIVITY)  TROPONIN I ([place] SENSITIVITY)   EKG ED ECG REPORT I, Naaman Plummer, the attending physician, personally viewed and interpreted this ECG. Date: 09/04/2022 EKG Time: 1133 Rate: 99 Rhythm: normal sinus rhythm QRS Axis: normal Intervals: normal ST/T Wave abnormalities: normal Narrative Interpretation: no evidence of acute ischemia RADIOLOGY ED MD interpretation: 2 view chest x-ray interpreted by me shows no evidence of acute abnormalities including no pneumonia, pneumothorax, or widened mediastinum -Agree with radiology assessment Official radiology report(s): DG Chest 2 View  Result Date: 09/04/2022 CLINICAL DATA:  Shortness of breath, chills, fatigue and cough. EXAM: CHEST - 2 VIEW COMPARISON:  03/25/2020. FINDINGS: Trachea is midline. Heart size normal. Lungs are hyperinflated but clear. No pleural fluid. IMPRESSION:  Hyperinflation without acute finding. Electronically Signed   By: Leanna Battles M.D.   On: 09/04/2022 11:52   PROCEDURES: Critical Care performed: No Procedures MEDICATIONS ORDERED IN ED: Medications  ipratropium-albuterol (DUONEB) 0.5-2.5 (3) MG/3ML nebulizer solution 3 mL (3 mLs Nebulization Given 09/04/22 1541)  methylPREDNISolone sodium succinate (SOLU-MEDROL) 125 mg/2 mL injection 125 mg (125 mg Intravenous Given 09/04/22 1541)   IMPRESSION / MDM / ASSESSMENT AND PLAN / ED COURSE  I reviewed the triage vital signs and the nursing notes.                             The patient is on the cardiac monitor to evaluate for evidence of arrhythmia and/or  significant heart rate changes. Patient's presentation is most consistent with acute presentation with potential threat to life or bodily function. The patient appears to be suffering from a moderate exacerbation of COPD.  Based on the history, exam, CXR/EKG, and further workup I dont suspect any other emergent cause of this presentation, such as pneumonia, acute coronary syndrome, congestive heart failure, pulmonary embolism, or pneumothorax.  ED Interventions: bronchodilators, steroids, antibiotics, reassess  1559 Reassessment: After treatment, the patients shortness of breath is resolved, and their lung exam has returned to baseline. They are comfortable and want to go home.  Rx: Steroids, Antibiotics, Albuterol Disposition: Discharge home with SRP. PCP follow up recommended in next 48hours.   FINAL CLINICAL IMPRESSION(S) / ED DIAGNOSES   Final diagnoses:  RSV infection  COPD with acute exacerbation (HCC)  Acute cough  SOB (shortness of breath) on exertion   Rx / DC Orders   ED Discharge Orders          Ordered    metFORMIN (GLUCOPHAGE) 1000 MG tablet  2 times daily        09/04/22 1544    gabapentin (NEURONTIN) 300 MG capsule  2 times daily        09/04/22 1544           Note:  This document was prepared using Dragon voice recognition software and may include unintentional dictation errors.   Merwyn Katos, MD 09/04/22 [phone]

## 2022-11-05 ENCOUNTER — Other Ambulatory Visit: Payer: Self-pay

## 2022-11-05 ENCOUNTER — Emergency Department
Admission: EM | Admit: 2022-11-05 | Discharge: 2022-11-05 | Disposition: A | Discharge status: Home / Self Care | Payer: Medicaid Other | Attending: Emergency Medicine | Admitting: Emergency Medicine

## 2022-11-05 DIAGNOSIS — L02413 Cutaneous abscess of right upper limb: Secondary | ICD-10-CM | POA: Diagnosis not present

## 2022-11-05 DIAGNOSIS — I10 Essential (primary) hypertension: Secondary | ICD-10-CM | POA: Diagnosis not present

## 2022-11-05 DIAGNOSIS — W57XXXA Bitten or stung by nonvenomous insect and other nonvenomous arthropods, initial encounter: Secondary | ICD-10-CM | POA: Diagnosis not present

## 2022-11-05 DIAGNOSIS — L0291 Cutaneous abscess, unspecified: Secondary | ICD-10-CM

## 2022-11-05 DIAGNOSIS — S40861A Insect bite (nonvenomous) of right upper arm, initial encounter: Secondary | ICD-10-CM | POA: Diagnosis present

## 2022-11-05 LAB — CBG MONITORING, ED: Glucose-Capillary: 131 mg/dL — ABNORMAL HIGH (ref 70–99)

## 2022-11-05 MED ORDER — LIDOCAINE HCL (PF) 1 % IJ SOLN
5.0000 mL | Freq: Once | INTRAMUSCULAR | Status: AC
  Administered 2022-11-05: 5 mL via INTRADERMAL
  Filled 2022-11-05: qty 5

## 2022-11-05 MED ORDER — DOXYCYCLINE MONOHYDRATE 100 MG PO TABS: 100.0000 mg | ORAL_TABLET | Freq: Two times a day (BID) | ORAL | 0 refills | Status: AC

## 2022-11-05 MED ORDER — ADULT MULTIVITAMIN W/MINERALS CH: 1.0000 | ORAL_TABLET | Freq: Every day | ORAL | 0 refills | Status: AC

## 2022-11-05 MED ORDER — AMLODIPINE BESYLATE 5 MG PO TABS: 5.0000 mg | ORAL_TABLET | Freq: Every day | ORAL | 3 refills | Status: AC

## 2022-11-05 MED ORDER — METFORMIN HCL 1000 MG PO TABS: 1000.0000 mg | ORAL_TABLET | Freq: Two times a day (BID) | ORAL | 1 refills | Status: AC

## 2022-11-05 MED ORDER — DOXYCYCLINE HYCLATE 100 MG PO TABS
100.0000 mg | ORAL_TABLET | Freq: Once | ORAL | Status: AC
  Administered 2022-11-05: 100 mg via ORAL
  Filled 2022-11-05: qty 1

## 2022-11-05 MED ORDER — BUDESONIDE-FORMOTEROL FUMARATE 80-4.5 MCG/ACT IN AERO: 2.0000 | INHALATION_SPRAY | Freq: Two times a day (BID) | RESPIRATORY_TRACT | 2 refills | Status: AC

## 2022-11-05 NOTE — ED Provider Notes (Addendum)
[facility] Provider Note    Event Date/Time   First MD Initiated Contact with Patient 11/05/22 1940     (approximate)   History   Insect Bite (X 6 days)   HPI  Terry Pruitt is a 62 y.o. male presents to the emergency department with a wound to his right upper extremity.  Patient believes that he got bit by something approximately 1 week ago.  Read wound to his right forearm that has not been draining.  Denies any fever or chills.  States that he has been out of all of his home medications other than metformin.  Does have a history of diabetes.  Denies any IV drug use.     Physical Exam   Triage Vital Signs: ED Triage Vitals  Enc Vitals Group     BP 11/05/22 1924 (!) 184/120     Pulse Rate 11/05/22 1924 (!) 104     Resp 11/05/22 1924 20     Temp 11/05/22 1924 98.9 F (37.2 C)     Temp Source 11/05/22 1924 Oral     SpO2 11/05/22 1924 99 %     Weight 11/05/22 1926 148 lb (67.1 kg)     Height 11/05/22 1926 '5\' 11"'$  (1.803 m)     Head Circumference --      Peak Flow --      Pain Score 11/05/22 1932 9     Pain Loc --      Pain Edu? --      Excl. in [place]? --     Most recent vital signs: Vitals:   11/05/22 1924 11/05/22 2031  BP: (!) 184/120 (!) 181/102  Pulse: (!) 104 92  Resp: 20 17  Temp: 98.9 F (37.2 C)   SpO2: 99% 98%    Physical Exam Constitutional:      Appearance: He is well-developed.  HENT:     Head: Atraumatic.  Eyes:     Conjunctiva/sclera: Conjunctivae normal.  Cardiovascular:     Rate and Rhythm: Regular rhythm.  Pulmonary:     Effort: No respiratory distress.  Musculoskeletal:     Cervical back: Normal range of motion.  Skin:    General: Skin is warm.     Comments: Erythema warmth and induration with fluctuance and purulent drainage to the right forearm.  No crepitance.  Neurological:     Mental Status: He is alert. Mental status is at baseline.      IMPRESSION / MDM / ASSESSMENT AND PLAN / ED COURSE  I reviewed  the triage vital signs and the nursing notes.  Differential diagnosis including abscess, cellulitis, spider bite   Labs (all labs ordered are listed, but only abnormal results are displayed) Labs interpreted as -    Labs Reviewed  CBG MONITORING, ED - Abnormal; Notable for the following components:      Result Value   Glucose-Capillary 131 (*)    All other components within normal limits      No significant hyperglycemia.  Incision and drainage.  Given first dose of antibiotic while in the emergency department.  Will start on doxycycline.  Given return precautions for any progression of symptoms.  Given information to establish care with a primary care provider.  Uncontrolled elevated blood pressure in the emergency department.  Has a history of noncompliance with antihypertensive medication.  Do not see that he has ever been prescribed an diet hamper tensive medications in the past.  Will start the patient on amlodipine  5 mg, given a 61-monthprescription and given information to establish care with primary care provider.  Patient asymptomatic.   PROCEDURES:  Critical Care performed: No  ..Incision and Drainage  Date/Time: 11/05/2022 8:22 PM  Performed by: MNathaniel Man MD Authorized by: MNathaniel Man MD   Consent:    Consent obtained:  Verbal   Consent given by:  Patient   Risks, benefits, and alternatives were discussed: yes     Risks discussed:  Bleeding, incomplete drainage, pain and infection   Alternatives discussed:  No treatment and delayed treatment Universal protocol:    Procedure explained and questions answered to patient or proxy's satisfaction: yes     Relevant documents present and verified: yes     Test results available : yes     Required blood products, implants, devices, and special equipment available: yes     Patient identity confirmed:  Verbally with patient, arm band and provided demographic data Location:    Type:  Abscess   Size:  2 cm    Location:  Upper extremity   Upper extremity location:  Arm   Arm location:  R upper arm Anesthesia:    Anesthesia method:  Local infiltration   Local anesthetic:  Lidocaine 1% w/o epi Procedure type:    Complexity:  Simple Procedure details:    Incision types:  Stab incision   Wound management:  Probed and deloculated   Drainage:  Bloody and purulent   Drainage amount:  Scant   Wound treatment:  Wound left open   Packing materials:  None Post-procedure details:    Procedure completion:  Tolerated well, no immediate complications   Patient's presentation is most consistent with acute illness / injury with system symptoms.   MEDICATIONS ORDERED IN ED: Medications  doxycycline (VIBRA-TABS) tablet 100 mg (has no administration in time range)  lidocaine (PF) (XYLOCAINE) 1 % injection 5 mL (5 mLs Intradermal Given by Other 11/05/22 2025)    FINAL CLINICAL IMPRESSION(S) / ED DIAGNOSES   Final diagnoses:  Insect bite of right upper arm, initial encounter  Abscess     Rx / DC Orders   ED Discharge Orders          Ordered    budesonide-formoterol (SYMBICORT) 80-4.5 MCG/ACT inhaler  2 times daily        11/05/22 1954    metFORMIN (GLUCOPHAGE) 1000 MG tablet  2 times daily        11/05/22 1954    Multiple Vitamin (MULTIVITAMIN WITH MINERALS) TABS tablet  Daily        11/05/22 1954    doxycycline (ADOXA) 100 MG tablet  2 times daily        11/05/22 2020             Note:  This document was prepared using Dragon voice recognition software and may include unintentional dictation errors.   MNathaniel Man MD 11/05/22 [id number]   MNathaniel Man MD 11/05/22 2043

## 2022-11-05 NOTE — ED Triage Notes (Signed)
Pt to ED from home for spider bite. Pt has large infected looking wound on back side of right forearm. Pt advised it happened about 6 days ago and has been treating it at home with no relief. Pt wound is red swollen and hot to the touch but pt is afebrile at this time.

## 2022-11-05 NOTE — Discharge Instructions (Addendum)
You are seen in the emergency department for a wound to your right arm.  Keep this area clean.  You can do warm water soaks to the area.  You are given a prescription for an antibiotic.  Take as prescribed.  Start calling primary care physicians to schedule close follow-up appointment.  Return to the emergency department if any symptoms worsen.  Pain control:  Ibuprofen (motrin/aleve/advil) - You can take 3-4 tablets (600-800 mg) every 6 hours as needed for pain/fever.  Acetaminophen (tylenol) - You can take 2 extra strength tablets (1000 mg) every 6 hours as needed for pain/fever.  You can alternate these medications or take them together.  Make sure you eat food/drink water when taking these medications.  Doxycycline - This medication can cause acid reflux.  It is important that you take it with food and drink plenty of water.  Do not lie down for 1 hour after taking this medication.  It also causes sun sensitivity so stay out of the sun or wear SPF while on this medication.
